# Patient Record
Sex: Male | Born: 1990 | Hispanic: Yes | Marital: Married | Smoking: Former smoker
Health system: Southern US, Community
[De-identification: ages and names within clinical notes are randomized; demographics above are authoritative.]

## PROBLEM LIST (undated history)

## (undated) ENCOUNTER — Emergency Department (HOSPITAL_COMMUNITY): Admission: EM | Discharge status: Absent without leave | Payer: Self-pay | Source: Home / Self Care

## (undated) DIAGNOSIS — K219 Gastro-esophageal reflux disease without esophagitis: Secondary | ICD-10-CM

## (undated) DIAGNOSIS — R51 Headache: Secondary | ICD-10-CM

## (undated) DIAGNOSIS — IMO0001 Reserved for inherently not codable concepts without codable children: Secondary | ICD-10-CM

## (undated) HISTORY — PX: VASECTOMY: SHX75

## (undated) HISTORY — PX: TONSILLECTOMY: SUR1361

---
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
## 2011-05-05 ENCOUNTER — Emergency Department: Payer: Self-pay | Admitting: Unknown Physician Specialty

## 2011-06-09 ENCOUNTER — Emergency Department: Payer: Self-pay | Admitting: Emergency Medicine

## 2011-07-19 ENCOUNTER — Emergency Department: Payer: Self-pay | Admitting: Emergency Medicine

## 2011-08-17 ENCOUNTER — Emergency Department: Payer: Self-pay | Admitting: Family Medicine

## 2011-08-18 ENCOUNTER — Emergency Department (HOSPITAL_COMMUNITY)
Admission: EM | Admit: 2011-08-18 | Discharge: 2011-08-18 | Disposition: A | Discharge status: Home / Self Care | Payer: Self-pay | Attending: Emergency Medicine | Admitting: Emergency Medicine

## 2011-08-18 DIAGNOSIS — M542 Cervicalgia: Secondary | ICD-10-CM | POA: Insufficient documentation

## 2011-08-18 DIAGNOSIS — G44209 Tension-type headache, unspecified, not intractable: Secondary | ICD-10-CM | POA: Insufficient documentation

## 2011-08-18 DIAGNOSIS — K219 Gastro-esophageal reflux disease without esophagitis: Secondary | ICD-10-CM | POA: Insufficient documentation

## 2011-08-18 DIAGNOSIS — M79609 Pain in unspecified limb: Secondary | ICD-10-CM | POA: Insufficient documentation

## 2011-08-18 DIAGNOSIS — M62838 Other muscle spasm: Secondary | ICD-10-CM | POA: Insufficient documentation

## 2011-08-18 DIAGNOSIS — M546 Pain in thoracic spine: Secondary | ICD-10-CM | POA: Insufficient documentation

## 2011-08-19 LAB — CBC
HCT: 40 % (ref 39.0–52.0)
Platelets: 218 10*3/uL (ref 150–400)
RBC: 4.94 MIL/uL (ref 4.22–5.81)
RDW: 13.1 % (ref 11.5–15.5)
WBC: 8.2 10*3/uL (ref 4.0–10.5)

## 2011-08-19 LAB — DIFFERENTIAL
Basophils Absolute: 0 10*3/uL (ref 0.0–0.1)
Eosinophils Relative: 2 % (ref 0–5)
Lymphocytes Relative: 37 % (ref 12–46)
Neutrophils Relative %: 50 % (ref 43–77)

## 2011-08-19 LAB — BASIC METABOLIC PANEL
BUN: 15 mg/dL (ref 6–23)
Chloride: 103 mEq/L (ref 96–112)
GFR calc Af Amer: >60 mL/min (ref >60)
Potassium: 4.2 mEq/L (ref 3.5–5.1)

## 2011-08-19 LAB — SEDIMENTATION RATE: Sed Rate: 0 mm/hr (ref 0–16)

## 2011-08-27 ENCOUNTER — Emergency Department (HOSPITAL_COMMUNITY)
Admission: EM | Admit: 2011-08-27 | Discharge: 2011-08-27 | Disposition: A | Discharge status: Home / Self Care | Payer: Self-pay | Attending: Emergency Medicine | Admitting: Emergency Medicine

## 2011-08-27 DIAGNOSIS — K219 Gastro-esophageal reflux disease without esophagitis: Secondary | ICD-10-CM | POA: Insufficient documentation

## 2011-08-27 DIAGNOSIS — M542 Cervicalgia: Secondary | ICD-10-CM | POA: Insufficient documentation

## 2011-08-27 DIAGNOSIS — R51 Headache: Secondary | ICD-10-CM | POA: Insufficient documentation

## 2011-09-12 ENCOUNTER — Inpatient Hospital Stay (INDEPENDENT_AMBULATORY_CARE_PROVIDER_SITE_OTHER)
Admission: RE | Admit: 2011-09-12 | Discharge: 2011-09-12 | Disposition: A | Discharge status: Home / Self Care | Payer: Self-pay | Source: Ambulatory Visit | Attending: Family Medicine | Admitting: Family Medicine

## 2011-09-12 DIAGNOSIS — G44209 Tension-type headache, unspecified, not intractable: Secondary | ICD-10-CM

## 2011-09-12 DIAGNOSIS — R51 Headache: Secondary | ICD-10-CM

## 2011-09-16 ENCOUNTER — Emergency Department (HOSPITAL_COMMUNITY): Payer: Self-pay

## 2011-09-16 ENCOUNTER — Emergency Department (HOSPITAL_COMMUNITY)
Admission: EM | Admit: 2011-09-16 | Discharge: 2011-09-17 | Disposition: A | Discharge status: Home / Self Care | Payer: Self-pay | Attending: Emergency Medicine | Admitting: Emergency Medicine

## 2011-09-16 DIAGNOSIS — F411 Generalized anxiety disorder: Secondary | ICD-10-CM | POA: Insufficient documentation

## 2011-09-16 DIAGNOSIS — R0789 Other chest pain: Secondary | ICD-10-CM | POA: Insufficient documentation

## 2011-09-16 DIAGNOSIS — J3489 Other specified disorders of nose and nasal sinuses: Secondary | ICD-10-CM | POA: Insufficient documentation

## 2011-09-16 DIAGNOSIS — R0602 Shortness of breath: Secondary | ICD-10-CM | POA: Insufficient documentation

## 2011-09-16 DIAGNOSIS — J40 Bronchitis, not specified as acute or chronic: Secondary | ICD-10-CM | POA: Insufficient documentation

## 2011-10-04 ENCOUNTER — Emergency Department (HOSPITAL_COMMUNITY)
Admission: EM | Admit: 2011-10-04 | Discharge: 2011-10-04 | Disposition: A | Discharge status: Home / Self Care | Payer: Medicaid Other | Attending: Emergency Medicine | Admitting: Emergency Medicine

## 2011-10-04 DIAGNOSIS — IMO0002 Reserved for concepts with insufficient information to code with codable children: Secondary | ICD-10-CM | POA: Insufficient documentation

## 2011-10-08 ENCOUNTER — Emergency Department (HOSPITAL_COMMUNITY)
Admission: EM | Admit: 2011-10-08 | Discharge: 2011-10-08 | Disposition: A | Discharge status: Home / Self Care | Payer: Medicaid Other | Attending: Emergency Medicine | Admitting: Emergency Medicine

## 2011-10-08 ENCOUNTER — Emergency Department (HOSPITAL_COMMUNITY): Payer: Medicaid Other

## 2011-10-08 DIAGNOSIS — IMO0001 Reserved for inherently not codable concepts without codable children: Secondary | ICD-10-CM | POA: Insufficient documentation

## 2011-10-08 DIAGNOSIS — N509 Disorder of male genital organs, unspecified: Secondary | ICD-10-CM | POA: Insufficient documentation

## 2011-10-08 DIAGNOSIS — K219 Gastro-esophageal reflux disease without esophagitis: Secondary | ICD-10-CM | POA: Insufficient documentation

## 2011-10-08 DIAGNOSIS — R059 Cough, unspecified: Secondary | ICD-10-CM | POA: Insufficient documentation

## 2011-10-08 DIAGNOSIS — B9789 Other viral agents as the cause of diseases classified elsewhere: Secondary | ICD-10-CM | POA: Insufficient documentation

## 2011-10-08 DIAGNOSIS — R300 Dysuria: Secondary | ICD-10-CM | POA: Insufficient documentation

## 2011-10-08 DIAGNOSIS — R05 Cough: Secondary | ICD-10-CM | POA: Insufficient documentation

## 2011-10-08 LAB — URINALYSIS, ROUTINE W REFLEX MICROSCOPIC
Nitrite: NEGATIVE
Specific Gravity, Urine: 1.013 (ref 1.005–1.030)
Urobilinogen, UA: 0.2 mg/dL (ref 0.0–1.0)

## 2011-10-09 LAB — RPR: RPR Ser Ql: NONREACTIVE

## 2011-10-11 LAB — GC/CHLAMYDIA PROBE AMP, GENITAL: GC Probe Amp, Genital: NEGATIVE

## 2011-11-04 ENCOUNTER — Ambulatory Visit
Admission: RE | Admit: 2011-11-04 | Discharge: 2011-11-04 | Disposition: A | Discharge status: Home / Self Care | Payer: Medicaid Other | Source: Ambulatory Visit | Attending: Specialist | Admitting: Specialist

## 2011-11-04 ENCOUNTER — Other Ambulatory Visit: Payer: Self-pay | Admitting: Specialist

## 2011-11-04 DIAGNOSIS — R52 Pain, unspecified: Secondary | ICD-10-CM

## 2011-11-07 ENCOUNTER — Encounter: Payer: Self-pay | Admitting: Emergency Medicine

## 2011-11-07 ENCOUNTER — Emergency Department (HOSPITAL_COMMUNITY)
Admission: EM | Admit: 2011-11-07 | Discharge: 2011-11-07 | Payer: Medicaid Other | Attending: Emergency Medicine | Admitting: Emergency Medicine

## 2011-11-07 DIAGNOSIS — M542 Cervicalgia: Secondary | ICD-10-CM | POA: Insufficient documentation

## 2011-11-07 DIAGNOSIS — R51 Headache: Secondary | ICD-10-CM | POA: Insufficient documentation

## 2011-11-07 MED ORDER — BUPIVACAINE HCL (PF) 0.5 % IJ SOLN
INTRAMUSCULAR | Status: AC
  Administered 2011-11-07: 13:00:00
  Filled 2011-11-07: qty 10

## 2011-11-07 MED ORDER — OXYCODONE-ACETAMINOPHEN 5-325 MG PO TABS: 2.0000 | ORAL_TABLET | ORAL | Status: AC | PRN

## 2011-11-07 NOTE — ED Notes (Signed)
Pt. Stated, I've had a HA since yesterday in the back of my head.

## 2011-11-07 NOTE — ED Provider Notes (Signed)
History     CSN: [id number] Arrival date & time: 11/07/2011 10:08 AM   First MD Initiated Contact with Patient 11/07/11 1122      Chief Complaint  Patient presents with  . Headache    (Consider location/radiation/quality/duration/timing/severity/associated sxs/prior treatment) HPI Pt c/o headache radiating down back of neck onset last night. Pain increases with looking down. Pt denies fever  History reviewed. No pertinent past medical history.  Past Surgical History  Procedure Date  . Tonsillectomy     No family history on file.  History  Substance Use Topics  . Smoking status: Never Smoker   . Smokeless tobacco: Not on file  . Alcohol Use: No      Review of Systems  Constitutional: Negative for fever.  Neurological: Positive for headaches. Negative for numbness.  All other systems reviewed and are negative.    Allergies  Azithromycin and Ibuprofen  Home Medications  No current outpatient prescriptions on file.  BP 107/54  Pulse 70  Temp(Src) 98.1 F (36.7 C) (Oral)  Resp 18  SpO2 99%  Physical Exam  Nursing note and vitals reviewed. Constitutional: He is oriented to person, place, and time. He appears well-developed and well-nourished. No distress.  HENT:  Head: Normocephalic and atraumatic.  Eyes: Pupils are equal, round, and reactive to light.  Neck: Normal range of motion.  Cardiovascular: Normal rate and intact distal pulses.   Pulmonary/Chest: No respiratory distress.  Abdominal: Normal appearance. He exhibits no distension.  Musculoskeletal: Normal range of motion.  Neurological: He is alert and oriented to person, place, and time. He has normal strength. No cranial nerve deficit or sensory deficit. Coordination and gait normal.  Skin: Skin is warm and dry. No rash noted.  Psychiatric: He has a normal mood and affect. His behavior is normal.    ED Course  Procedures (including critical care time) Area lateral to C6 prepped with Betadine  the skin was clean and 5 cc of 0.5% Marcaine injected in the paracervical plexus.  Aspiration done throughout procedure.  No complications.  Time out and appropriate precautions with identification of patient done prior to procedure. Labs Reviewed - No data to display No results found.   1. Headache       MDM          Nelia Shi, MD 11/07/11 1318

## 2011-11-07 NOTE — ED Notes (Signed)
Pt c/o headache radiating down back of neck onset last night. Pain increases with looking down. Pt denies fever.

## 2011-11-19 ENCOUNTER — Encounter (HOSPITAL_COMMUNITY): Payer: Self-pay | Admitting: *Deleted

## 2011-11-19 ENCOUNTER — Emergency Department (HOSPITAL_COMMUNITY)
Admission: EM | Admit: 2011-11-19 | Discharge: 2011-11-19 | Disposition: A | Discharge status: Home / Self Care | Payer: Medicaid Other | Attending: Emergency Medicine | Admitting: Emergency Medicine

## 2011-11-19 DIAGNOSIS — R509 Fever, unspecified: Secondary | ICD-10-CM | POA: Insufficient documentation

## 2011-11-19 DIAGNOSIS — IMO0001 Reserved for inherently not codable concepts without codable children: Secondary | ICD-10-CM | POA: Insufficient documentation

## 2011-11-19 DIAGNOSIS — R220 Localized swelling, mass and lump, head: Secondary | ICD-10-CM | POA: Insufficient documentation

## 2011-11-19 DIAGNOSIS — J029 Acute pharyngitis, unspecified: Secondary | ICD-10-CM | POA: Insufficient documentation

## 2011-11-19 DIAGNOSIS — R599 Enlarged lymph nodes, unspecified: Secondary | ICD-10-CM | POA: Insufficient documentation

## 2011-11-19 DIAGNOSIS — R51 Headache: Secondary | ICD-10-CM | POA: Insufficient documentation

## 2011-11-19 MED ORDER — PENICILLIN V POTASSIUM 250 MG PO TABS
500.0000 mg | ORAL_TABLET | Freq: Once | ORAL | Status: AC
  Administered 2011-11-19: 500 mg via ORAL
  Filled 2011-11-19: qty 2

## 2011-11-19 MED ORDER — PENICILLIN V POTASSIUM 500 MG PO TABS: 500.0000 mg | ORAL_TABLET | Freq: Three times a day (TID) | ORAL | Status: AC

## 2011-11-19 NOTE — ED Provider Notes (Signed)
Medical screening examination/treatment/procedure(s) were performed by non-physician practitioner and as supervising physician I was immediately available for consultation/collaboration.  Saramarie Stinger R. Tachina Spoonemore, MD 11/19/11 2349 

## 2011-11-19 NOTE — ED Provider Notes (Signed)
History     CSN: [id number] Arrival date & time: 11/19/2011  3:51 PM   First MD Initiated Contact with Patient 11/19/11 2003      Chief Complaint  Patient presents with  . Fever    (Consider location/radiation/quality/duration/timing/severity/associated sxs/prior treatment) HPI Comments: Patient reports, that he's had a sore throat and a low-grade fever for the past 2 days.  Sister was diagnosed with strep throat.  Several days before that he has been taking Tylenol for discomfort.  Patient is a 20 y.o. male presenting with fever. The history is provided by the patient.  Fever Primary symptoms of the febrile illness include fever, headaches and myalgias. Primary symptoms do not include nausea, vomiting or diarrhea. This is a new problem. The problem has not changed since onset.   History reviewed. No pertinent past medical history.  Past Surgical History  Procedure Date  . Tonsillectomy     History reviewed. No pertinent family history.  History  Substance Use Topics  . Smoking status: Passive Smoker  . Smokeless tobacco: Not on file  . Alcohol Use: No      Review of Systems  Constitutional: Positive for fever.  HENT: Positive for sore throat.   Respiratory: Negative.   Cardiovascular: Negative.   Gastrointestinal: Negative for nausea, vomiting and diarrhea.  Genitourinary: Negative.   Musculoskeletal: Positive for myalgias.  Neurological: Positive for headaches.  Hematological: Negative.   Psychiatric/Behavioral: Negative.     Allergies  Azithromycin and Ibuprofen  Home Medications   Current Outpatient Rx  Name Route Sig Dispense Refill  . PENICILLIN V POTASSIUM 500 MG PO TABS Oral Take 1 tablet (500 mg total) by mouth 3 (three) times daily. 30 tablet 0    BP 116/70  Pulse 92  Temp(Src) 99.7 F (37.6 C) (Oral)  Resp 20  SpO2 99%  Physical Exam  Constitutional: He appears well-developed and well-nourished.  HENT:  Head: Normocephalic.    Mouth/Throat: Posterior oropharyngeal edema and posterior oropharyngeal erythema present. No oropharyngeal exudate or tonsillar abscesses.  Eyes: EOM are normal.  Neck: Neck supple. No rigidity. Normal range of motion present.  Cardiovascular: Normal rate.   Abdominal: Soft.  Musculoskeletal: Normal range of motion.  Lymphadenopathy:    He has cervical adenopathy.  Skin: Skin is warm and dry.    ED Course  Procedures (including critical care time)  Labs Reviewed - No data to display No results found.   1. Pharyngitis       MDM  Pharyngitis most likely strep as Positive exposure         Arman Filter, NP 11/19/11 2044  Arman Filter, NP 11/19/11 2046

## 2011-11-19 NOTE — ED Notes (Signed)
He woke up this am with a temp headache and a scratchey throat since wednesday

## 2011-12-17 NOTE — ED Notes (Signed)
9/16 Dr. Juanetta Gosling had requested an appt. for pt. to f/u at the [facility]. I called them but they don't see Medicaid pt.'s. Dr. Juanetta Gosling said to try [facility]. I faxed referral to [facility] with pt.'s record. I called pt. and he said he wants to wait until he get his Medicaid before referral done. [facility] notified. 11/13 Fax received from [facility] that pt. has appt. 12/20 @1500  and pt. is aware of appt. Referral completed. Harold Anderson 12/17/2011

## 2012-01-25 ENCOUNTER — Other Ambulatory Visit: Payer: Self-pay | Admitting: Specialist

## 2012-01-25 DIAGNOSIS — R55 Syncope and collapse: Secondary | ICD-10-CM

## 2012-01-25 DIAGNOSIS — R51 Headache: Secondary | ICD-10-CM

## 2012-01-25 DIAGNOSIS — R42 Dizziness and giddiness: Secondary | ICD-10-CM

## 2012-01-28 ENCOUNTER — Other Ambulatory Visit: Payer: Medicaid Other

## 2012-02-01 ENCOUNTER — Emergency Department (HOSPITAL_COMMUNITY)
Admission: EM | Admit: 2012-02-01 | Discharge: 2012-02-02 | Disposition: A | Discharge status: Home / Self Care | Payer: Medicaid Other | Attending: Emergency Medicine | Admitting: Emergency Medicine

## 2012-02-01 DIAGNOSIS — R55 Syncope and collapse: Secondary | ICD-10-CM | POA: Insufficient documentation

## 2012-02-01 DIAGNOSIS — H547 Unspecified visual loss: Secondary | ICD-10-CM | POA: Insufficient documentation

## 2012-02-01 DIAGNOSIS — R51 Headache: Secondary | ICD-10-CM | POA: Insufficient documentation

## 2012-02-02 ENCOUNTER — Encounter (HOSPITAL_COMMUNITY): Payer: Self-pay

## 2012-02-02 ENCOUNTER — Emergency Department (HOSPITAL_COMMUNITY): Payer: Medicaid Other

## 2012-02-02 LAB — POCT I-STAT, CHEM 8
Calcium, Ion: 1.21 mmol/L (ref 1.12–1.32)
Glucose, Bld: 108 mg/dL — ABNORMAL HIGH (ref 70–99)
HCT: 49 % (ref 39.0–52.0)
Hemoglobin: 16.7 g/dL (ref 13.0–17.0)

## 2012-02-02 MED ORDER — DIPHENHYDRAMINE HCL 50 MG/ML IJ SOLN
25.0000 mg | Freq: Once | INTRAMUSCULAR | Status: AC
  Administered 2012-02-02: 25 mg via INTRAVENOUS
  Filled 2012-02-02: qty 1

## 2012-02-02 MED ORDER — SODIUM CHLORIDE 0.9 % IV SOLN
Freq: Once | INTRAVENOUS | Status: AC
  Administered 2012-02-02: 05:00:00 via INTRAVENOUS

## 2012-02-02 MED ORDER — DEXAMETHASONE SODIUM PHOSPHATE 10 MG/ML IJ SOLN
10.0000 mg | Freq: Once | INTRAMUSCULAR | Status: AC
  Administered 2012-02-02: 10 mg via INTRAVENOUS
  Filled 2012-02-02: qty 1

## 2012-02-02 MED ORDER — METOCLOPRAMIDE HCL 5 MG/ML IJ SOLN
10.0000 mg | Freq: Once | INTRAMUSCULAR | Status: AC
  Administered 2012-02-02: 10 mg via INTRAVENOUS
  Filled 2012-02-02: qty 2

## 2012-02-02 NOTE — ED Provider Notes (Signed)
Medical screening examination/treatment/procedure(s) were performed by non-physician practitioner and as supervising physician I was immediately available for consultation/collaboration.  Xiara Knisley R. Ady Heimann, MD 02/02/12 0843 

## 2012-02-02 NOTE — ED Provider Notes (Signed)
History     CSN: [id number]  Arrival date & time 02/01/12  2346   First MD Initiated Contact with Patient 02/02/12 [phone]      Chief Complaint  Patient presents with  . Headache  . Loss of Vision    HPI: Patient is a 21 y.o. male presenting with headaches. The history is provided by the patient.  Headache  This is a recurrent problem. The current episode started more than 1 week ago. The problem occurs constantly. The headache is associated with nothing. The pain is located in the right unilateral region. The pain is at a severity of 10/10. The pain is severe. The pain does not radiate. Associated symptoms include near-syncope and nausea. Pertinent negatives include no fever and no vomiting. He has tried NSAIDs for the symptoms. The treatment provided no relief.  Patient reports approximately three-month history of persistent headaches. States the headaches have been constant but intensity varies. Has been seen by his primary care physician several times with headaches. PCP believes headaches due to muscle tension. Patient states medications he's been given so far are not helping such as muscle relaxers and tramadol. Tonight while at work headache persisted and he suddenly became weak, his vision faded and he states he felt like he was going  to "black out" but did not. States his current headache is consistent with headaches he's had over the last several months. Visual symptoms have resolved.  History reviewed. No pertinent past medical history.  Past Surgical History  Procedure Date  . Tonsillectomy     History reviewed. No pertinent family history.  History  Substance Use Topics  . Smoking status: Passive Smoker  . Smokeless tobacco: Not on file  . Alcohol Use: No      Review of Systems  Constitutional: Negative.  Negative for fever.  Eyes: Negative.   Respiratory: Negative.   Cardiovascular: Positive for near-syncope.  Gastrointestinal: Positive for nausea. Negative for  vomiting.  Genitourinary: Negative.   Musculoskeletal: Negative.   Skin: Negative.   Neurological: Positive for headaches.  Hematological: Negative.   Psychiatric/Behavioral: Negative.     Allergies  Azithromycin and Ibuprofen  Home Medications   Current Outpatient Rx  Name Route Sig Dispense Refill  . TRAMADOL HCL 50 MG PO TABS Oral Take 100 mg by mouth every 6 (six) hours as needed.      BP 122/68  Pulse 85  Temp(Src) 97.8 F (36.6 C) (Oral)  Resp 18  Wt 190 lb (86.183 kg)  SpO2 100%  Physical Exam  Constitutional: He is oriented to person, place, and time. He appears well-developed and well-nourished.  HENT:  Head: Normocephalic and atraumatic.  Nose: Nose normal.  Eyes: Conjunctivae and EOM are normal. Pupils are equal, round, and reactive to light.  Neck: Normal range of motion. Neck supple.  Cardiovascular: Normal rate and regular rhythm.   Pulmonary/Chest: Effort normal and breath sounds normal.  Abdominal: Soft. Bowel sounds are normal.  Musculoskeletal: Normal range of motion.  Neurological: He is alert and oriented to person, place, and time. He has normal strength and normal reflexes. No cranial nerve deficit. He displays a negative Romberg sign. Coordination normal.  Skin: Skin is warm and dry.  Psychiatric: He has a normal mood and affect.    ED Course  Procedures  CT head without contrast without acute findings. Patient reports headache improved with IV fluids and medication. Findings and clinical impression discussed with patient. Will plan to discharge home and encourage close follow up  with primary care physician for persistent headaches. Patient agreeable with plan.  Labs Reviewed  POCT I-STAT, CHEM 8 - Abnormal; Notable for the following:    Glucose, Bld 108 (*)    All other components within normal limits   Ct Head Wo Contrast  02/02/2012  *RADIOLOGY REPORT*  Clinical Data: Right-sided headache  CT HEAD WITHOUT CONTRAST  Technique:  Contiguous  axial images were obtained from the base of the skull through the vertex without contrast.  Comparison: None.  Findings: There is no evidence for acute hemorrhage, hydrocephalus, mass lesion, or abnormal extra-axial fluid collection.  No definite CT evidence for acute infarction.  The visualized paranasal sinuses and mastoid air cells are predominately clear.  IMPRESSION: No acute intracranial abnormality.  Original Report Authenticated By: Waneta Martins, M.D.     No diagnosis found.    MDM  HPI/PE and clinical findings c/w 1. Persistent H/A's (Ct normal, no focal neurological findings, H/A improved w/ IVF's and meds) 2. Near syncope        Leanne Chang, NP 02/02/12 [phone]

## 2012-02-02 NOTE — ED Notes (Signed)
Pt complains of a right sided headache for three days, tonight while at work, his family received a call that he had a "blackout episode" and he lost his vision for a short period of time

## 2012-02-04 ENCOUNTER — Ambulatory Visit: Payer: Medicaid Other | Admitting: Physical Therapy

## 2012-02-18 ENCOUNTER — Ambulatory Visit: Payer: Medicaid Other | Admitting: Physical Therapy

## 2012-04-26 ENCOUNTER — Emergency Department (HOSPITAL_COMMUNITY)
Admission: EM | Admit: 2012-04-26 | Discharge: 2012-04-26 | Disposition: A | Discharge status: Home / Self Care | Payer: Medicaid Other | Attending: Emergency Medicine | Admitting: Emergency Medicine

## 2012-04-26 ENCOUNTER — Encounter (HOSPITAL_COMMUNITY): Payer: Self-pay | Admitting: Family Medicine

## 2012-04-26 DIAGNOSIS — Y990 Civilian activity done for income or pay: Secondary | ICD-10-CM | POA: Insufficient documentation

## 2012-04-26 DIAGNOSIS — S61209A Unspecified open wound of unspecified finger without damage to nail, initial encounter: Secondary | ICD-10-CM | POA: Insufficient documentation

## 2012-04-26 DIAGNOSIS — Z23 Encounter for immunization: Secondary | ICD-10-CM | POA: Insufficient documentation

## 2012-04-26 DIAGNOSIS — W278XXA Contact with other nonpowered hand tool, initial encounter: Secondary | ICD-10-CM | POA: Insufficient documentation

## 2012-04-26 MED ORDER — TETANUS-DIPHTH-ACELL PERTUSSIS 5-2.5-18.5 LF-MCG/0.5 IM SUSP
0.5000 mL | Freq: Once | INTRAMUSCULAR | Status: AC
  Administered 2012-04-26: 0.5 mL via INTRAMUSCULAR
  Filled 2012-04-26: qty 0.5

## 2012-04-26 MED ORDER — CEPHALEXIN 500 MG PO CAPS: 500.0000 mg | ORAL_CAPSULE | Freq: Two times a day (BID) | ORAL | Status: AC

## 2012-04-26 MED ORDER — "THROMBI-PAD 3""X3"" EX PADS"
MEDICATED_PAD | CUTANEOUS | Status: AC
  Filled 2012-04-26: qty 1

## 2012-04-26 MED ORDER — HYDROCODONE-ACETAMINOPHEN 5-325 MG PO TABS: 1.0000 | ORAL_TABLET | ORAL | Status: AC | PRN

## 2012-04-26 NOTE — ED Provider Notes (Signed)
History     CSN: [id number]  Arrival date & time 04/26/12  1001   First MD Initiated Contact with Patient 04/26/12 1017      Chief Complaint  Patient presents with  . Laceration    (Consider location/radiation/quality/duration/timing/severity/associated sxs/prior treatment) HPI Comments: Patient reports he accidentally cut the tip of his left thumb off with a meat slicer while at work.  Denies other injury.  Denies weakness or numbness of the finger, denies difficulty moving finger.    Patient is a 21 y.o. male presenting with skin laceration. The history is provided by the patient.  Laceration     History reviewed. No pertinent past medical history.  Past Surgical History  Procedure Date  . Tonsillectomy     History reviewed. No pertinent family history.  History  Substance Use Topics  . Smoking status: Passive Smoker  . Smokeless tobacco: Not on file  . Alcohol Use: No      Review of Systems  Skin: Positive for wound.  Neurological: Negative for syncope, weakness and numbness.  Psychiatric/Behavioral: Negative for confusion.    Allergies  Azithromycin and Ibuprofen  Home Medications   Current Outpatient Rx  Name Route Sig Dispense Refill  . TRAMADOL HCL 50 MG PO TABS Oral Take 100 mg by mouth every 6 (six) hours as needed. For pain      BP 129/97  Pulse 81  Temp(Src) 98.8 F (37.1 C) (Oral)  Resp 20  SpO2 100%  Physical Exam  Nursing note and vitals reviewed. Constitutional: He is oriented to person, place, and time. He appears well-developed and well-nourished.  HENT:  Head: Normocephalic and atraumatic.  Neck: Neck supple.  Pulmonary/Chest: Effort normal.  Musculoskeletal:       Hands:      Left thumb with full AROM, full strength, sensation intact, capillary refill less than two seconds.    Neurological: He is alert and oriented to person, place, and time.  Skin: Laceration noted.    ED Course  Procedures (including critical care  time)  Labs Reviewed - No data to display No results found.   LACERATION REPAIR Performed by: Rise Patience Consent: Verbal consent obtained. Risks and benefits: risks, benefits and alternatives were discussed Patient identity confirmed: provided demographic data Time out performed prior to procedure Prepped and Draped in normal sterile fashion Wound explored  Laceration Location: left distal thumb   Laceration Length:  Amputation - superficial, involving part of nail bed  No Foreign Bodies seen or palpated  Anesthesia: digital block  Local anesthetic: lidocaine 2% no epinephrine  Anesthetic total: 5 ml  Irrigation method: syringe, lavage Amount of cleaning: standard  Skin closure: none.  Thrombipad and pressure  Number of sutures or staples: 0  Technique: see above  Patient tolerance: Patient tolerated the procedure well with no immediate complications.    1. Fingertip amputation, initial encounter       MDM  Patient presents to ED after amputation distal aspect of thumb.  Amputation was complete, nothing to suture. Neurovascularly intact.  The wound is not deep enough for bony involvement.  It is clean cut and there is no evidence of FB.  Wound thoroughly cleansed in ED.  Thrombi pad placed with good improvement of bleeding.  Wound dressed.  I discussed return precautions with the patient, including monitoring for infection as well as uncontrolled bleeding.  Pt d/c home with keflex to prevent infection and pain medication.  Pt to follow up with his PCP, urgent care, or  ER in two days for a recheck to ensure proper wound healing.  Patient verbalizes understanding and agrees with plan.         Rise Patience, [place] 04/26/12 1328

## 2012-04-26 NOTE — ED Provider Notes (Signed)
Medical screening examination/treatment/procedure(s) were performed by non-physician practitioner and as supervising physician I was immediately available for consultation/collaboration.   Gwyneth Sprout, MD 04/26/12 2041

## 2012-04-26 NOTE — ED Notes (Signed)
Pt sts was at work today and cut tip of left thumb with meat slicer.

## 2012-04-26 NOTE — Discharge Instructions (Signed)
Read the information below.  Keep the wound covered and do not change the bandage for 24 hours.  Then, please keep the wound clean and covered with a thin layer of antibiotic ointment.  Please see Dr Mayford Knife, the urgent care, or the ER in 2 days for a recheck of the wound.  Return to the ER immediately if you develop redness, swelling, pus draining from the wound, red streaks going up your arm, difficulty bending or your thumb, or fevers greater than 100.4.  Please also return for uncontrolled bleeding not relieved with 10 minutes of direct pressure. You may return to the ER at any time for worsening condition or any new symptoms that concern you.

## 2012-10-03 ENCOUNTER — Emergency Department (INDEPENDENT_AMBULATORY_CARE_PROVIDER_SITE_OTHER)
Admission: EM | Admit: 2012-10-03 | Discharge: 2012-10-03 | Disposition: A | Discharge status: Home / Self Care | Payer: Medicaid Other | Source: Home / Self Care | Attending: Family Medicine | Admitting: Family Medicine

## 2012-10-03 ENCOUNTER — Encounter (HOSPITAL_COMMUNITY): Payer: Self-pay | Admitting: *Deleted

## 2012-10-03 DIAGNOSIS — K219 Gastro-esophageal reflux disease without esophagitis: Secondary | ICD-10-CM

## 2012-10-03 DIAGNOSIS — R51 Headache: Secondary | ICD-10-CM

## 2012-10-03 MED ORDER — ESOMEPRAZOLE MAGNESIUM 40 MG PO CPDR: 40.0000 mg | DELAYED_RELEASE_CAPSULE | Freq: Every day | ORAL | Status: DC

## 2012-10-03 MED ORDER — GI COCKTAIL ~~LOC~~
30.0000 mL | Freq: Once | ORAL | Status: AC
  Administered 2012-10-03: 30 mL via ORAL

## 2012-10-03 MED ORDER — GI COCKTAIL ~~LOC~~
ORAL | Status: AC
  Filled 2012-10-03: qty 30

## 2012-10-03 NOTE — ED Provider Notes (Signed)
History     CSN: [id number]  Arrival date & time 10/03/12  1731   First MD Initiated Contact with Patient 10/03/12 1745      Chief Complaint  Patient presents with  . Headache    (Consider location/radiation/quality/duration/timing/severity/associated sxs/prior treatment) Patient is a 21 y.o. male presenting with headaches. The history is provided by the patient.  Headache The primary symptoms include headaches. Primary symptoms do not include dizziness, fever, nausea or vomiting. The symptoms began more than 1 week ago (1 yr h/o occipital ha. see nurse's note.). The symptoms are unchanged. The neurological symptoms are focal.  The headache is associated with neck stiffness and loss of balance. The headache is not associated with aura, photophobia or weakness.  Additional symptoms include neck stiffness and loss of balance. Additional symptoms do not include weakness, photophobia, aura, hallucinations or vertigo. Workup history includes CT scan.    History reviewed. No pertinent past medical history.  Past Surgical History  Procedure Date  . Tonsillectomy     Family History  Problem Relation Age of Onset  . Heart failure Other     History  Substance Use Topics  . Smoking status: Former Games developer  . Smokeless tobacco: Not on file  . Alcohol Use: No      Review of Systems  Constitutional: Negative for fever.  HENT: Positive for neck stiffness.   Eyes: Negative for photophobia.  Gastrointestinal: Negative for nausea and vomiting.       Epigastric and substernal burning sensation.  Neurological: Positive for headaches and loss of balance. Negative for dizziness, vertigo and weakness.  Psychiatric/Behavioral: Negative for hallucinations.    Allergies  Azithromycin and Ibuprofen  Home Medications   Current Outpatient Rx  Name Route Sig Dispense Refill  . ESOMEPRAZOLE MAGNESIUM 40 MG PO CPDR Oral Take 1 capsule (40 mg total) by mouth daily. 30 capsule 0  . TRAMADOL  HCL 50 MG PO TABS Oral Take 100 mg by mouth every 6 (six) hours as needed. For pain      BP 136/75  Pulse 78  Temp 98.4 F (36.9 C) (Oral)  Resp 18  SpO2 100%  Physical Exam  Nursing note and vitals reviewed. Constitutional: He is oriented to person, place, and time. He appears well-developed and well-nourished.  HENT:  Head: Normocephalic.  Mouth/Throat: Oropharynx is clear and moist.  Eyes: Conjunctivae normal are normal. Pupils are equal, round, and reactive to light.  Neck: Normal range of motion. Neck supple.  Cardiovascular: Normal rate, regular rhythm, normal heart sounds and intact distal pulses.   Pulmonary/Chest: Breath sounds normal.  Abdominal: Soft. Bowel sounds are normal. There is tenderness in the epigastric area. There is no rigidity, no guarding and no CVA tenderness.  Lymphadenopathy:    He has no cervical adenopathy.  Neurological: He is alert and oriented to person, place, and time.  Skin: Skin is warm and dry.    ED Course  Procedures (including critical care time)  Labs Reviewed - No data to display No results found.   1. Headache, chronic daily   2. GERD (gastroesophageal reflux disease)       MDM          Linna Hoff, MD 10/03/12 [phone]

## 2012-10-03 NOTE — ED Notes (Signed)
Pt reports " i have had a headache for the past year and it is going down his back. I have seen my primary doctor and he has run ever kind of test and I have been to the er twice and had two cat scans - they can't find anything. I have an appointment with the neurologist on the 18th"

## 2012-10-03 NOTE — ED Notes (Signed)
Pt reports that he has tried otc meds, prescription meds,.warm heat , acupuncture, muscle relaxants and herbal meds with little relief

## 2012-10-08 ENCOUNTER — Encounter (HOSPITAL_COMMUNITY): Payer: Self-pay | Admitting: *Deleted

## 2012-10-08 ENCOUNTER — Emergency Department (HOSPITAL_COMMUNITY)
Admission: EM | Admit: 2012-10-08 | Discharge: 2012-10-09 | Disposition: A | Discharge status: Home / Self Care | Payer: Medicaid Other | Attending: Emergency Medicine | Admitting: Emergency Medicine

## 2012-10-08 DIAGNOSIS — Z87891 Personal history of nicotine dependence: Secondary | ICD-10-CM | POA: Insufficient documentation

## 2012-10-08 DIAGNOSIS — R0602 Shortness of breath: Secondary | ICD-10-CM | POA: Insufficient documentation

## 2012-10-08 DIAGNOSIS — R071 Chest pain on breathing: Secondary | ICD-10-CM | POA: Insufficient documentation

## 2012-10-08 DIAGNOSIS — R0789 Other chest pain: Secondary | ICD-10-CM

## 2012-10-08 HISTORY — DX: Headache: R51

## 2012-10-08 NOTE — ED Notes (Signed)
Pt arrived in room, Lab at bedside

## 2012-10-08 NOTE — ED Notes (Signed)
C/o sob, onset 1 week ago, also some nausea and "chest hurts", (denies: cough, congestion, cold sx, fever, nvd or dizziness).

## 2012-10-09 ENCOUNTER — Emergency Department (HOSPITAL_COMMUNITY): Payer: Medicaid Other

## 2012-10-09 LAB — CBC WITH DIFFERENTIAL/PLATELET
Eosinophils Relative: 1 % (ref 0–5)
HCT: 46.1 % (ref 39.0–52.0)
Hemoglobin: 15.4 g/dL (ref 13.0–17.0)
MCH: 27 pg (ref 26.0–34.0)
MCHC: 33.4 g/dL (ref 30.0–36.0)
Neutro Abs: 7.4 10*3/uL (ref 1.7–7.7)
Neutrophils Relative %: 67 % (ref 43–77)
RDW: 12.7 % (ref 11.5–15.5)

## 2012-10-09 LAB — POCT I-STAT TROPONIN I: Troponin i, poc: 0.02 ng/mL (ref 0.00–0.08)

## 2012-10-09 LAB — POCT I-STAT, CHEM 8
BUN: 8 mg/dL (ref 6–23)
Calcium, Ion: 1.18 mmol/L (ref 1.12–1.23)
Chloride: 103 mEq/L (ref 96–112)
Sodium: 141 mEq/L (ref 135–145)

## 2012-10-09 LAB — D-DIMER, QUANTITATIVE: D-Dimer, Quant: <0.27 ug/mL-FEU (ref 0.00–0.48)

## 2012-10-09 MED ORDER — TRAMADOL HCL 50 MG PO TABS: 50.0000 mg | ORAL_TABLET | Freq: Four times a day (QID) | ORAL | Status: DC | PRN

## 2012-10-09 MED ORDER — ACETAMINOPHEN 325 MG PO TABS
650.0000 mg | ORAL_TABLET | Freq: Once | ORAL | Status: AC
  Administered 2012-10-09: 650 mg via ORAL
  Filled 2012-10-09: qty 2

## 2012-10-09 NOTE — ED Notes (Signed)
Pt states "I've been having real hard to breathe sessions and my chest has been tight." Denies cough, congestion. NAD noted.

## 2012-10-09 NOTE — ED Provider Notes (Signed)
History     CSN: [id number]  Arrival date & time 10/08/12  2340   First MD Initiated Contact with Patient 10/08/12 2355      Chief Complaint  Patient presents with  . Shortness of Breath    (Consider location/radiation/quality/duration/timing/severity/associated sxs/prior treatment) HPI Hx per PT. Sternal non radiating CP for the last week, sharp and sore and sometimes stinging. Hurts to take a deep breath, hurts to move his arms or any kind of movement including laying on his stomach watching TV propped up on his arms. Mild to MOD pain. PCP Dr Mayford Knife.  No recent illness. No trauma or heavy lifting. No cough. No SOB other than hurts to take a deep breath. No leg pain or swelling. Sister has h/o DVT. PT denies h/o same. No longer taking nexium, denies heartburn.  Past Medical History  Diagnosis Date  . Headache     Past Surgical History  Procedure Date  . Tonsillectomy     Family History  Problem Relation Age of Onset  . Heart failure Other     History  Substance Use Topics  . Smoking status: Former Smoker    Quit date: 08/08/2012  . Smokeless tobacco: Not on file  . Alcohol Use: No      Review of Systems  Constitutional: Negative for fever and chills.  HENT: Negative for neck pain and neck stiffness.   Eyes: Negative for pain.  Respiratory: Negative for cough and chest tightness.   Cardiovascular: Positive for chest pain.  Gastrointestinal: Negative for abdominal pain.  Genitourinary: Negative for dysuria.  Musculoskeletal: Negative for back pain.  Skin: Negative for rash.  Neurological: Negative for headaches.  All other systems reviewed and are negative.    Allergies  Azithromycin and Ibuprofen  Home Medications   Current Outpatient Rx  Name Route Sig Dispense Refill  . ESOMEPRAZOLE MAGNESIUM 40 MG PO CPDR Oral Take 1 capsule (40 mg total) by mouth daily. 30 capsule 0  . TRAMADOL HCL 50 MG PO TABS Oral Take 100 mg by mouth every 6 (six) hours as  needed. For pain      BP 130/77  Pulse 90  Temp 98.4 F (36.9 C) (Oral)  Resp 18  SpO2 98%  Physical Exam  Constitutional: He is oriented to person, place, and time. He appears well-developed and well-nourished.  HENT:  Head: Normocephalic and atraumatic.  Eyes: Conjunctivae normal and EOM are normal. Pupils are equal, round, and reactive to light.  Neck: Trachea normal. Neck supple. No thyromegaly present.  Cardiovascular: Normal rate, regular rhythm, S1 normal, S2 normal and normal pulses.     No systolic murmur is present   No diastolic murmur is present  Pulses:      Radial pulses are 2+ on the right side, and 2+ on the left side.  Pulmonary/Chest: Effort normal and breath sounds normal. He has no wheezes. He has no rhonchi. He has no rales.       Reproducible chest tenderness over sternum, no crepitus or rash  Abdominal: Soft. Normal appearance and bowel sounds are normal. There is no tenderness. There is no CVA tenderness and negative Murphy's sign.  Musculoskeletal:       BLE:s Calves nontender, no cords or erythema, negative Homans sign  Neurological: He is alert and oriented to person, place, and time. He has normal strength. No cranial nerve deficit or sensory deficit. GCS eye subscore is 4. GCS verbal subscore is 5. GCS motor subscore is 6.  Skin: Skin  is warm and dry. No rash noted. He is not diaphoretic.  Psychiatric: His speech is normal.       Cooperative and appropriate    ED Course  Procedures (including critical care time)  Results for orders placed during the hospital encounter of 10/08/12  CBC WITH DIFFERENTIAL      Component Value Range   WBC 11.1 (*) 4.0 - 10.5 K/uL   RBC 5.71  4.22 - 5.81 MIL/uL   Hemoglobin 15.4  13.0 - 17.0 g/dL   HCT 04.5  40.9 - 81.1 %   MCV 80.7  78.0 - 100.0 fL   MCH 27.0  26.0 - 34.0 pg   MCHC 33.4  30.0 - 36.0 g/dL   RDW 91.4  78.2 - 95.6 %   Platelets 257  150 - 400 K/uL   Neutrophils Relative 67  43 - 77 %   Neutro Abs  7.4  1.7 - 7.7 K/uL   Lymphocytes Relative 26  12 - 46 %   Lymphs Abs 2.8  0.7 - 4.0 K/uL   Monocytes Relative 7  3 - 12 %   Monocytes Absolute 0.7  0.1 - 1.0 K/uL   Eosinophils Relative 1  0 - 5 %   Eosinophils Absolute 0.1  0.0 - 0.7 K/uL   Basophils Relative 0  0 - 1 %   Basophils Absolute 0.0  0.0 - 0.1 K/uL  D-DIMER, QUANTITATIVE      Component Value Range   D-Dimer, Quant <0.27  0.00 - 0.48 ug/mL-FEU  POCT I-STAT, CHEM 8      Component Value Range   Sodium 141  135 - 145 mEq/L   Potassium 4.2  3.5 - 5.1 mEq/L   Chloride 103  96 - 112 mEq/L   BUN 8  6 - 23 mg/dL   Creatinine, Ser 2.13  0.50 - 1.35 mg/dL   Glucose, Bld 086 (*) 70 - 99 mg/dL   Calcium, Ion 5.78  4.69 - 1.23 mmol/L   TCO2 27  0 - 100 mmol/L   Hemoglobin 16.7  13.0 - 17.0 g/dL   HCT 62.9  52.8 - 41.3 %  POCT I-STAT TROPONIN I      Component Value Range   Troponin i, poc 0.02  0.00 - 0.08 ng/mL   Comment 3            Dg Chest 2 View  10/09/2012  *RADIOLOGY REPORT*  Clinical Data: Shortness of breath that started 1 week ago.  Nausea and chest discomfort.  Smoker.  CHEST - 2 VIEW  Comparison: 10/08/2011  Findings: A minimal pectus excavatum deformity. Midline trachea. Normal heart size and mediastinal contours. No pleural effusion or pneumothorax.  Clear lungs.  IMPRESSION: No acute cardiopulmonary disease.   Original Report Authenticated By: Consuello Bossier, M.D.      Date: 10/09/2012  Rate: 77  Rhythm: normal sinus rhythm  QRS Axis: normal  Intervals: normal  ST/T Wave abnormalities: nonspecific ST changes  Conduction Disutrbances:none  Narrative Interpretation:   Old EKG Reviewed: none available  On further history, had tried his sisters albuterol inhaler without any change in symptoms.   Motrin allergy, given tylenol and RX for ultram. Chest wall pain precautions verbalized as understood.   Plan PCP follow up and return here for any worsening condition.  MDM   Reproducible cheat wall pain  suggesting non cardiac CP, screening ECG, labs and CXR as above. RX and precautions provided. VS and nursing notes reviewed.  Sunnie Nielsen, MD 10/09/12 [phone]

## 2012-10-09 NOTE — ED Notes (Signed)
MD at bedside. 

## 2012-11-08 ENCOUNTER — Encounter (HOSPITAL_COMMUNITY): Payer: Self-pay | Admitting: Emergency Medicine

## 2012-11-08 ENCOUNTER — Emergency Department (INDEPENDENT_AMBULATORY_CARE_PROVIDER_SITE_OTHER)
Admission: EM | Admit: 2012-11-08 | Discharge: 2012-11-08 | Disposition: A | Discharge status: Home / Self Care | Payer: Medicaid Other | Source: Home / Self Care | Attending: Family Medicine | Admitting: Family Medicine

## 2012-11-08 DIAGNOSIS — G43909 Migraine, unspecified, not intractable, without status migrainosus: Secondary | ICD-10-CM

## 2012-11-08 DIAGNOSIS — K219 Gastro-esophageal reflux disease without esophagitis: Secondary | ICD-10-CM

## 2012-11-08 HISTORY — DX: Reserved for inherently not codable concepts without codable children: IMO0001

## 2012-11-08 HISTORY — DX: Gastro-esophageal reflux disease without esophagitis: K21.9

## 2012-11-08 LAB — POCT H PYLORI SCREEN: H. PYLORI SCREEN, POC: NEGATIVE

## 2012-11-08 LAB — POCT RAPID STREP A: Streptococcus, Group A Screen (Direct): NEGATIVE

## 2012-11-08 MED ORDER — TRAMADOL-ACETAMINOPHEN 37.5-325 MG PO TABS: 1.0000 | ORAL_TABLET | Freq: Four times a day (QID) | ORAL | Status: DC | PRN

## 2012-11-08 MED ORDER — RANITIDINE HCL 150 MG PO CAPS: 150.0000 mg | ORAL_CAPSULE | Freq: Every day | ORAL | Status: DC

## 2012-11-08 MED ORDER — METOCLOPRAMIDE HCL 5 MG PO TABS: 5.0000 mg | ORAL_TABLET | Freq: Two times a day (BID) | ORAL | Status: DC | PRN

## 2012-11-08 MED ORDER — ESOMEPRAZOLE MAGNESIUM 40 MG PO CPDR: DELAYED_RELEASE_CAPSULE | ORAL | Status: DC

## 2012-11-08 NOTE — ED Notes (Signed)
Sore throat, nausea, and diarrhea.  Patient reports he experiences these symptoms every day.  Symptoms for 2 months.  Notified pcp, thought to be reflux.

## 2012-11-08 NOTE — ED Provider Notes (Signed)
History     CSN: [id number]  Arrival date & time 11/08/12  1419   First MD Initiated Contact with Patient 11/08/12 1508      Chief Complaint  Patient presents with  . Sore Throat    (Consider location/radiation/quality/duration/timing/severity/associated sxs/prior treatment) HPI Comments: 21 year old male former smoker with history of migraines and acid reflux. Here complaining of:  #1) headache exacerbation. Patient stated he is being followed by neurologist and had a recent MRI and has the pending result he denies any seizures, no visual changes no extremity weakness or numbness, no balance or gait problems. Patient reports that he needs refilled his tramadol as he ran out and his primary doctor's office was closed.  #2) sore throat for 2 months. Patient has been diagnosed with acid reflux in the past. He states that he no longer has acid taste in his mouth and he doesn't think acid reflux is the cause of the sore throat. He is no longer smoking. his symptoms are  associated with epigastric discomfort, nausea,  decreased appetite and also reports some episodes of loose stools. Denies fever or chills. No sudden weight loss. He is currently taking omeprazole once a day inconsistently.    Past Medical History  Diagnosis Date  . Headache   . Reflux     Past Surgical History  Procedure Date  . Tonsillectomy     Family History  Problem Relation Age of Onset  . Heart failure Other     History  Substance Use Topics  . Smoking status: Former Smoker    Quit date: 08/08/2012  . Smokeless tobacco: Not on file  . Alcohol Use: No      Review of Systems  Constitutional: Positive for appetite change. Negative for fever, chills, diaphoresis, fatigue and unexpected weight change.  HENT: Positive for sore throat. Negative for ear pain, congestion, trouble swallowing and neck pain.   Gastrointestinal: Positive for nausea. Negative for vomiting.  Musculoskeletal: Negative for myalgias  and arthralgias.  Neurological: Positive for headaches. Negative for dizziness, seizures, weakness and numbness.  Hematological: Negative for adenopathy.  All other systems reviewed and are negative.    Allergies  Azithromycin and Ibuprofen  Home Medications   Current Outpatient Rx  Name  Route  Sig  Dispense  Refill  . NEXIUM PO   Oral   Take by mouth.         . OMEPRAZOLE PO   Oral   Take by mouth.         Marland Kitchen RANITIDINE HCL PO   Oral   Take by mouth.         . TRAMADOL HCL 50 MG PO TABS   Oral   Take 1 tablet (50 mg total) by mouth every 6 (six) hours as needed for pain.   15 tablet   0   . TRAMADOL-ACETAMINOPHEN 37.5-325 MG PO TABS   Oral   Take 1 tablet by mouth 3 (three) times daily as needed. For pain           BP 122/83  Pulse 80  Temp 98.5 F (36.9 C) (Oral)  Resp 20  SpO2 99%  Physical Exam  Nursing note and vitals reviewed. Constitutional: He is oriented to person, place, and time. He appears well-developed and well-nourished. No distress.  HENT:  Head: Normocephalic and atraumatic.  Right Ear: External ear normal.  Left Ear: External ear normal.       Nose normal Significant pharyngeal erythema no exudates. No uvula deviation.  No trismus. TM's normal  Eyes: Conjunctivae normal are normal. No scleral icterus.  Neck: Neck supple. No thyromegaly present.  Cardiovascular: Normal rate, regular rhythm and normal heart sounds.   Pulmonary/Chest: Effort normal and breath sounds normal. No respiratory distress. He has no wheezes. He has no rales.  Abdominal: Soft. He exhibits no distension and no mass. There is no rebound and no guarding.       Reported epigastric tenderness with deep palpation  Lymphadenopathy:       Head (right side): No submandibular, no preauricular, no posterior auricular and no occipital adenopathy present.       Head (left side): No submandibular, no preauricular, no posterior auricular and no occipital adenopathy  present.    He has no cervical adenopathy.    He has no axillary adenopathy.  Neurological: He is alert and oriented to person, place, and time.  Skin: No rash noted. He is not diaphoretic.    ED Course  Procedures (including critical care time)   Labs Reviewed  POCT RAPID STREP A ([facility] CARE ONLY)  POCT H PYLORI SCREEN   No results found.   1. Migraine headache   2. Acid reflux       MDM  1) Migraines:Unchanged from prior episodes. Refilled Ultracet, prescribed Reglan. 2) Sore throat: Negative poc strep and negative H.pilory. Treated with nexium, ranitidine and antireflux measures.  Impress possible underlying mood disorder.  Asked to follow up with PCP to monitor symptoms. GI referral provided today to follow up as needed.         Sharin Grave, MD 11/09/12 [phone]

## 2013-05-22 ENCOUNTER — Emergency Department (HOSPITAL_COMMUNITY)
Admission: EM | Admit: 2013-05-22 | Discharge: 2013-05-22 | Discharge status: Against Medical Advice | Payer: Medicaid Other | Attending: Emergency Medicine | Admitting: Emergency Medicine

## 2013-05-22 ENCOUNTER — Encounter (HOSPITAL_COMMUNITY): Payer: Self-pay | Admitting: Adult Health

## 2013-05-22 DIAGNOSIS — Z8719 Personal history of other diseases of the digestive system: Secondary | ICD-10-CM | POA: Insufficient documentation

## 2013-05-22 DIAGNOSIS — J029 Acute pharyngitis, unspecified: Secondary | ICD-10-CM | POA: Insufficient documentation

## 2013-05-22 LAB — RAPID STREP SCREEN (MED CTR MEBANE ONLY): Streptococcus, Group A Screen (Direct): NEGATIVE

## 2013-05-22 NOTE — ED Notes (Signed)
Present with sore throat for 4 months. Has been seen at primary care and Dx with GERD. Pt wants referral to ENT, states, "it hurts under my adam's apple and it feels like I can't breath when I lay down at night"

## 2016-02-07 ENCOUNTER — Emergency Department (HOSPITAL_COMMUNITY)
Admission: EM | Admit: 2016-02-07 | Discharge: 2016-02-07 | Disposition: A | Discharge status: Home / Self Care | Payer: Worker's Compensation | Attending: Emergency Medicine | Admitting: Emergency Medicine

## 2016-02-07 ENCOUNTER — Emergency Department (HOSPITAL_COMMUNITY): Payer: Worker's Compensation

## 2016-02-07 ENCOUNTER — Encounter (HOSPITAL_COMMUNITY): Payer: Self-pay | Admitting: *Deleted

## 2016-02-07 DIAGNOSIS — S29002A Unspecified injury of muscle and tendon of back wall of thorax, initial encounter: Secondary | ICD-10-CM | POA: Insufficient documentation

## 2016-02-07 DIAGNOSIS — W108XXA Fall (on) (from) other stairs and steps, initial encounter: Secondary | ICD-10-CM | POA: Insufficient documentation

## 2016-02-07 DIAGNOSIS — Y9289 Other specified places as the place of occurrence of the external cause: Secondary | ICD-10-CM | POA: Diagnosis not present

## 2016-02-07 DIAGNOSIS — S3992XA Unspecified injury of lower back, initial encounter: Secondary | ICD-10-CM

## 2016-02-07 DIAGNOSIS — Z8719 Personal history of other diseases of the digestive system: Secondary | ICD-10-CM | POA: Diagnosis not present

## 2016-02-07 DIAGNOSIS — Y9389 Activity, other specified: Secondary | ICD-10-CM | POA: Diagnosis not present

## 2016-02-07 DIAGNOSIS — Y990 Civilian activity done for income or pay: Secondary | ICD-10-CM | POA: Diagnosis not present

## 2016-02-07 DIAGNOSIS — W19XXXA Unspecified fall, initial encounter: Secondary | ICD-10-CM

## 2016-02-07 DIAGNOSIS — Z87891 Personal history of nicotine dependence: Secondary | ICD-10-CM | POA: Insufficient documentation

## 2016-02-07 MED ORDER — OXYCODONE-ACETAMINOPHEN 5-325 MG PO TABS: 2.0000 | ORAL_TABLET | ORAL | Status: DC | PRN

## 2016-02-07 MED ORDER — HYDROMORPHONE HCL 1 MG/ML IJ SOLN
1.0000 mg | Freq: Once | INTRAMUSCULAR | Status: AC
  Administered 2016-02-07: 1 mg via INTRAMUSCULAR
  Filled 2016-02-07: qty 1

## 2016-02-07 MED ORDER — HYDROMORPHONE HCL 1 MG/ML IJ SOLN
1.0000 mg | Freq: Once | INTRAMUSCULAR | Status: AC
  Administered 2016-02-07: 1 mg via INTRAVENOUS
  Filled 2016-02-07: qty 1

## 2016-02-07 NOTE — Discharge Instructions (Signed)
Back Exercises The following exercises strengthen the muscles that help to support the back. They also help to keep the lower back flexible. Doing these exercises can help to prevent back pain or lessen existing pain. If you have back pain or discomfort, try doing these exercises 2-3 times each day or as told by your health care provider. When the pain goes away, do them once each day, but increase the number of times that you repeat the steps for each exercise (do more repetitions). If you do not have back pain or discomfort, do these exercises once each day or as told by your health care provider. EXERCISES Single Knee to Chest Repeat these steps 3-5 times for each leg:  Lie on your back on a firm bed or the floor with your legs extended.  Bring one knee to your chest. Your other leg should stay extended and in contact with the floor.  Hold your knee in place by grabbing your knee or thigh.  Pull on your knee until you feel a gentle stretch in your lower back.  Hold the stretch for 10-30 seconds.  Slowly release and straighten your leg. Pelvic Tilt Repeat these steps 5-10 times:  Lie on your back on a firm bed or the floor with your legs extended.  Bend your knees so they are pointing toward the ceiling and your feet are flat on the floor.  Tighten your lower abdominal muscles to press your lower back against the floor. This motion will tilt your pelvis so your tailbone points up toward the ceiling instead of pointing to your feet or the floor.  With gentle tension and even breathing, hold this position for 5-10 seconds. Cat-Cow Repeat these steps until your lower back becomes more flexible:  Get into a hands-and-knees position on a firm surface. Keep your hands under your shoulders, and keep your knees under your hips. You may place padding under your knees for comfort.  Let your head hang down, and point your tailbone toward the floor so your lower back becomes rounded like the  back of a cat.  Hold this position for 5 seconds.  Slowly lift your head and point your tailbone up toward the ceiling so your back forms a sagging arch like the back of a cow.  Hold this position for 5 seconds. Press-Ups Repeat these steps 5-10 times:  Lie on your abdomen (face-down) on the floor.  Place your palms near your head, about shoulder-width apart.  While you keep your back as relaxed as possible and keep your hips on the floor, slowly straighten your arms to raise the top half of your body and lift your shoulders. Do not use your back muscles to raise your upper torso. You may adjust the placement of your hands to make yourself more comfortable.  Hold this position for 5 seconds while you keep your back relaxed.  Slowly return to lying flat on the floor. Bridges Repeat these steps 10 times:  Lie on your back on a firm surface.  Bend your knees so they are pointing toward the ceiling and your feet are flat on the floor.  Tighten your buttocks muscles and lift your buttocks off of the floor until your waist is at almost the same height as your knees. You should feel the muscles working in your buttocks and the back of your thighs. If you do not feel these muscles, slide your feet 1-2 inches farther away from your buttocks.  Hold this position for 3-5  seconds.  Slowly lower your hips to the starting position, and allow your buttocks muscles to relax completely. If this exercise is too easy, try doing it with your arms crossed over your chest. Abdominal Crunches Repeat these steps 5-10 times:  Lie on your back on a firm bed or the floor with your legs extended.  Bend your knees so they are pointing toward the ceiling and your feet are flat on the floor.  Cross your arms over your chest.  Tip your chin slightly toward your chest without bending your neck.  Tighten your abdominal muscles and slowly raise your trunk (torso) high enough to lift your shoulder blades a  tiny bit off of the floor. Avoid raising your torso higher than that, because it can put too much stress on your low back and it does not help to strengthen your abdominal muscles.  Slowly return to your starting position. Back Lifts Repeat these steps 5-10 times: 1. Lie on your abdomen (face-down) with your arms at your sides, and rest your forehead on the floor. 2. Tighten the muscles in your legs and your buttocks. 3. Slowly lift your chest off of the floor while you keep your hips pressed to the floor. Keep the back of your head in line with the curve in your back. Your eyes should be looking at the floor. 4. Hold this position for 3-5 seconds. 5. Slowly return to your starting position. SEEK MEDICAL CARE IF:  Your back pain or discomfort gets much worse when you do an exercise.  Your back pain or discomfort does not lessen within 2 hours after you exercise. If you have any of these problems, stop doing these exercises right away. Do not do them again unless your health care provider says that you can. SEEK IMMEDIATE MEDICAL CARE IF:  You develop sudden, severe back pain. If this happens, stop doing the exercises right away. Do not do them again unless your health care provider says that you can.   This information is not intended to replace advice given to you by your health care provider. Make sure you discuss any questions you have with your health care provider.   Document Released: 01/20/2005 Document Revised: 09/03/2015 Document Reviewed: 02/06/2015 [employer] Interactive Patient Education 2016 [employer] Back Injury Prevention Back injuries can be very painful. They can also be difficult to heal. After having one back injury, you are more likely to injure your back again. It is important to learn how to avoid injuring or re-injuring your back. The following tips can help you to prevent a back injury. WHAT SHOULD I KNOW ABOUT PHYSICAL FITNESS?  Exercise for 30 minutes per day  on most days of the week or as directed by your health care provider. Make sure to:  Do aerobic exercises, such as walking, jogging, biking, or swimming.  Do exercises that increase balance and strength, such as tai chi and yoga. These can decrease your risk of falling and injuring your back.  Do stretching exercises to help with flexibility.  Try to develop strong abdominal muscles. Your abdominal muscles provide a lot of the support that is needed by your back.  Maintain a healthy weight. This helps to decrease your risk of a back injury. WHAT SHOULD I KNOW ABOUT MY DIET?  Talk with your health care provider about your overall diet. Take supplements and vitamins only as directed by your health care provider.  Talk with your health care provider about how much calcium and vitamin   D you need each day. These nutrients help to prevent weakening of the bones (osteoporosis). Osteoporosis can cause broken (fractured) bones, which lead to back pain.  Include good sources of calcium in your diet, such as dairy products, green leafy vegetables, and products that have had calcium added to them (fortified).  Include good sources of vitamin D in your diet, such as milk and foods that are fortified with vitamin D. WHAT SHOULD I KNOW ABOUT MY POSTURE?  Sit up straight and stand up straight. Avoid leaning forward when you sit or hunching over when you stand.  Choose chairs that have good low-back (lumbar) support.  If you work at a desk, sit close to it so you do not need to lean over. Keep your chin tucked in. Keep your neck drawn back, and keep your elbows bent at a right angle. Your arms should look like the letter "L."  Sit high and close to the steering wheel when you drive. Add a lumbar support to your car seat, if needed.  Avoid sitting or standing in one position for very long. Take breaks to get up, stretch, and walk around at least one time every hour. Take breaks every hour if you are  driving for long periods of time.  Sleep on your side with your knees slightly bent, or sleep on your back with a pillow under your knees. Do not lie on the front of your body to sleep. WHAT SHOULD I KNOW ABOUT LIFTING, TWISTING, AND REACHING? Lifting and Heavy Lifting  Avoid heavy lifting, especially repetitive heavy lifting. If you must do heavy lifting:  Stretch before lifting.  Work slowly.  Rest between lifts.  Use a tool such as a cart or a dolly to move objects if one is available.  Make several small trips instead of carrying one heavy load.  Ask for help when you need it, especially when moving big objects.  Follow these steps when lifting:  Stand with your feet shoulder-width apart.  Get as close to the object as you can. Do not try to pick up a heavy object that is far from your body.  Use handles or lifting straps if they are available.  Bend at your knees. Squat down, but keep your heels off the floor.  Keep your shoulders pulled back, your chin tucked in, and your back straight.  Lift the object slowly while you tighten the muscles in your legs, abdomen, and buttocks. Keep the object as close to the center of your body as possible.  Follow these steps when putting down a heavy load:  Stand with your feet shoulder-width apart.  Lower the object slowly while you tighten the muscles in your legs, abdomen, and buttocks. Keep the object as close to the center of your body as possible.  Keep your shoulders pulled back, your chin tucked in, and your back straight.  Bend at your knees. Squat down, but keep your heels off the floor.  Use handles or lifting straps if they are available. Twisting and Reaching  Avoid lifting heavy objects above your waist.  Do not twist at your waist while you are lifting or carrying a load. If you need to turn, move your feet.  Do not bend over without bending at your knees.  Avoid reaching over your head, across a table, or  for an object on a high surface. WHAT ARE SOME OTHER TIPS?  Avoid wet floors and icy ground. Keep sidewalks clear of ice to prevent falls.    falls.  Do not sleep on a mattress that is too soft or too hard.  Keep items that are used frequently within easy reach.  Put heavier objects on shelves at waist level, and put lighter objects on lower or higher shelves.  Find ways to decrease your stress, such as exercise, massage, or relaxation techniques. Stress can build up in your muscles. Tense muscles are more vulnerable to injury.  Talk with your health care provider if you feel anxious or depressed. These conditions can make back pain worse.  Wear flat heel shoes with cushioned soles.  Avoid sudden movements.  Use both shoulder straps when carrying a backpack.  Do not use any tobacco products, including cigarettes, chewing tobacco, or electronic cigarettes. If you need help quitting, ask your health care provider.   This information is not intended to replace advice given to you by your health care provider. Make sure you discuss any questions you have with your health care provider.  Follow up with orthopedic provider for re-evaluation. Take pain medication as prescribed. Apply ice to affected area. Return to the ED if you experience worsening of your symptoms, bowel or bladder incontinence, numbness or tingling in your extremities.

## 2016-02-07 NOTE — ED Notes (Signed)
Per EMS, pt tripped and fell down 6 stairs at work today. Pt complains of 10/10 pain his mid back radiating to tailbone. Pt denies loss of consciousness or head injury. Pt is not on blood thinners. Pt dx'd with torn ligament in right arm 2 weeks ago, right arm is in sling. Pt took hydrocodone at 9AM today for right arm injury.

## 2016-02-07 NOTE — ED Notes (Signed)
Bed: WA19 Expected date:  Expected time:  Means of arrival:  Comments: EMS/Fall 

## 2016-02-07 NOTE — ED Notes (Signed)
Pt states he is unable to ambulate due to back pain. EDPA notified.

## 2016-02-07 NOTE — ED Provider Notes (Signed)
CSN: [id number]     Arrival date & time 02/07/16  1209 History   First MD Initiated Contact with Patient 02/07/16 1226     Chief Complaint  Patient presents with  . Fall  . Back Pain     (Consider location/radiation/quality/duration/timing/severity/associated sxs/prior Treatment) HPI   Harold Anderson is a 25 y.o M with no significant pmhx who presents to the ED today c/o back pain. Pt states that he was at work when he stepped backwards and tripped over a hose and fell backwards down 6 stairs. Pt landed directly on his lower back which is where is has significant pain now. No head injury of LOC. No neck pain. No sensory deficits. Pt able to move all extremities. No bowel/bladder incontinence or saddle anesthesia. No meds given PTA. Pt has not ambulated since injury.   Past Medical History  Diagnosis Date  . Headache(784.0)   . Reflux    Past Surgical History  Procedure Laterality Date  . Tonsillectomy     Family History  Problem Relation Age of Onset  . Heart failure Other    Social History  Substance Use Topics  . Smoking status: Former Smoker    Quit date: 08/08/2012  . Smokeless tobacco: None  . Alcohol Use: No    Review of Systems  All other systems reviewed and are negative.     Allergies  Azithromycin  Home Medications   Prior to Admission medications   Medication Sig Start Date End Date Taking? Authorizing Provider  HYDROcodone-acetaminophen (NORCO/VICODIN) 5-325 MG tablet Take 1 tablet by mouth every 6 (six) hours as needed for moderate pain.   Yes Historical Provider, MD   BP 116/72 mmHg  Pulse 79  Temp(Src) 98.1 F (36.7 C) (Oral)  Resp 18  SpO2 98% Physical Exam  Constitutional: He is oriented to person, place, and time. He appears well-developed and well-nourished. No distress.  HENT:  Head: Normocephalic and atraumatic.  Mouth/Throat: No oropharyngeal exudate.  Eyes: Conjunctivae and EOM are normal. Pupils are equal, round, and reactive to  light. Right eye exhibits no discharge. Left eye exhibits no discharge. No scleral icterus.  Cardiovascular: Normal rate, regular rhythm, normal heart sounds and intact distal pulses.  Exam reveals no gallop and no friction rub.   No murmur heard. Pulmonary/Chest: Effort normal and breath sounds normal. No respiratory distress. He has no wheezes. He has no rales. He exhibits no tenderness.  Abdominal: Soft. He exhibits no distension. There is no tenderness. There is no guarding.  Musculoskeletal: Normal range of motion. He exhibits no edema.  Midline spinal tenderness to thoracic, lumbar spine and sacrum. Pain increased with rotation, but no decrease ROM. No hip or pelvic TTP. No cervical spinal tenderness. No step offs or obvious bony deformity. Pt able to move all extremities without difficulty.   Neurological: He is alert and oriented to person, place, and time.  Strength 5/5 throughout. No sensory deficits.  Pt able to ambulate, but states is painful.   Skin: Skin is warm and dry. No rash noted. He is not diaphoretic. No erythema. No pallor.  Psychiatric: He has a normal mood and affect. His behavior is normal.  Nursing note and vitals reviewed.   ED Course  Procedures (including critical care time) Labs Review Labs Reviewed - No data to display  Imaging Review Dg Thoracic Spine 2 View  02/07/2016  CLINICAL DATA:  Pain following fall down stairs EXAM: THORACIC SPINE 2 VIEWS COMPARISON:  None. FINDINGS: Frontal and lateral views  were obtained. There is mild upper thoracic levoscoliosis. There is no demonstrable fracture or spondylolisthesis. The disc spaces appear normal. There is no erosive change or paraspinous lesion. IMPRESSION: Mild scoliosis. No fracture or spondylolisthesis. No appreciable arthropathy. Electronically Signed   By: Bretta Bang III M.D.   On: 02/07/2016 13:22   Dg Lumbar Spine Complete  02/07/2016  CLINICAL DATA:  Pain following fall down stairs EXAM: LUMBAR  SPINE - COMPLETE 4+ VIEW COMPARISON:  None. FINDINGS: Frontal, lateral, spot lumbosacral lateral, and bilateral oblique views were obtained. There are 5 non-rib-bearing lumbar type vertebral bodies. There is no demonstrable fracture or spondylolisthesis. The disc spaces appear normal. There is no appreciable facet arthropathy. IMPRESSION: No fracture or spondylolisthesis. No appreciable arthropathic change. Electronically Signed   By: Bretta Bang III M.D.   On: 02/07/2016 13:23   Dg Sacrum/coccyx  02/07/2016  CLINICAL DATA:  Pain following fall down stairs EXAM: SACRUM AND COCCYX - 2+ VIEW COMPARISON:  None. FINDINGS: Frontal and lateral views were obtained. There is no fracture or diastases. Joint spaces appear intact. No erosive change. IMPRESSION: No fracture or diastases.  No appreciable arthropathy. Electronically Signed   By: Bretta Bang III M.D.   On: 02/07/2016 13:24   I have personally reviewed and evaluated these images and lab results as part of my medical decision-making.   EKG Interpretation None      MDM   Final diagnoses:  Fall, initial encounter  Back injury, initial encounter    Patient with back pain after a traumatic fall down 6 stairs.  No neurological deficits and normal neuro exam. No loss of bowel or bladder control.  No concern for cauda equina. Xray negative for fracture. NO head injury or LOC. C-spine cleared with Nexus criteria. Pt originally did not want to ambulate due to pain. Pain managed. Pt was then able to ambulate in room without assistance.  RICE protocol and pain medicine indicated and discussed with patient. Pt will follow up with ortho.  Case discussed with Dr. Radford Pax.     Lester Kinsman [place], PA-C 02/08/16 4098  Nelva Nay, MD 02/23/16 1345

## 2016-02-21 IMAGING — CR DG SACRUM/COCCYX 2+V
3 series · 3 of 3 positions shown · non-contrast
Comparison: None.

CLINICAL DATA: Pain following fall down stairs

EXAM:
SACRUM AND COCCYX - 2+ VIEW

[t sacrum coccyx lat]
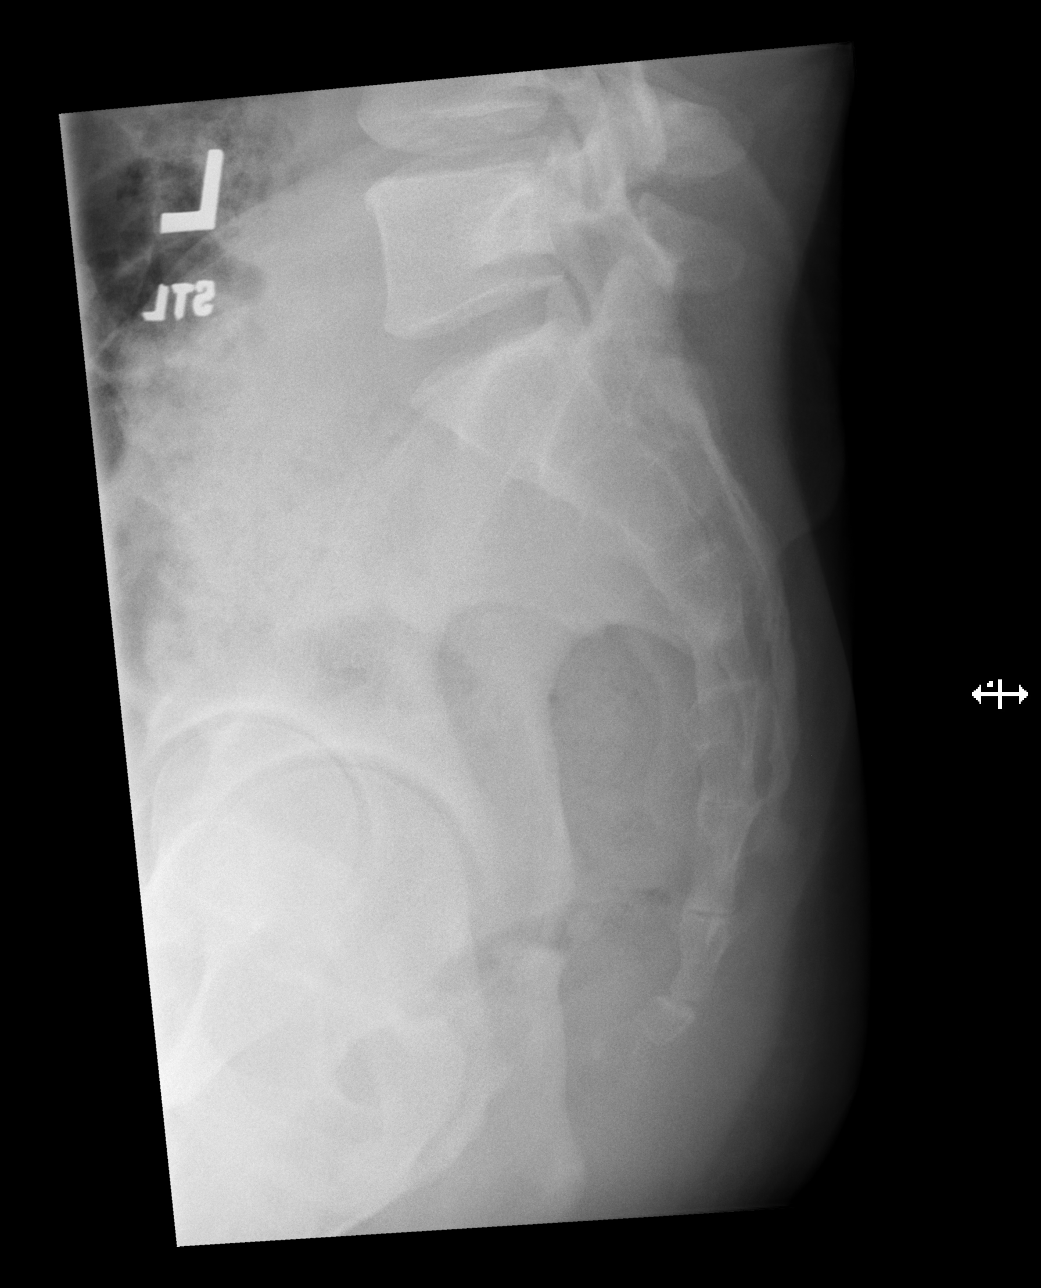

[t sacrum ap]
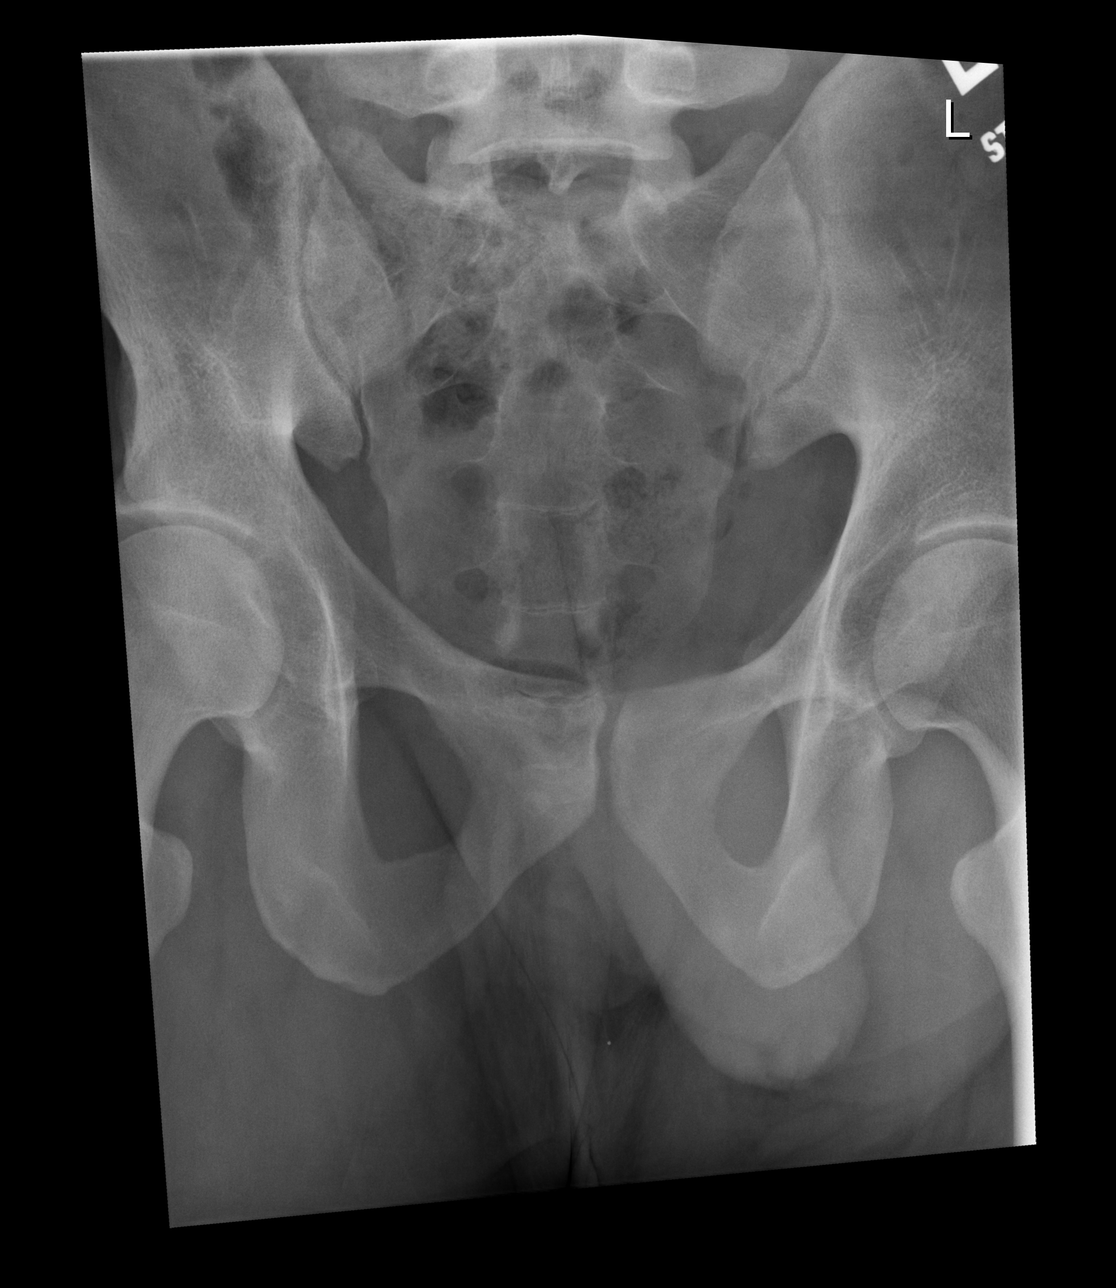

[t coccyx ap]
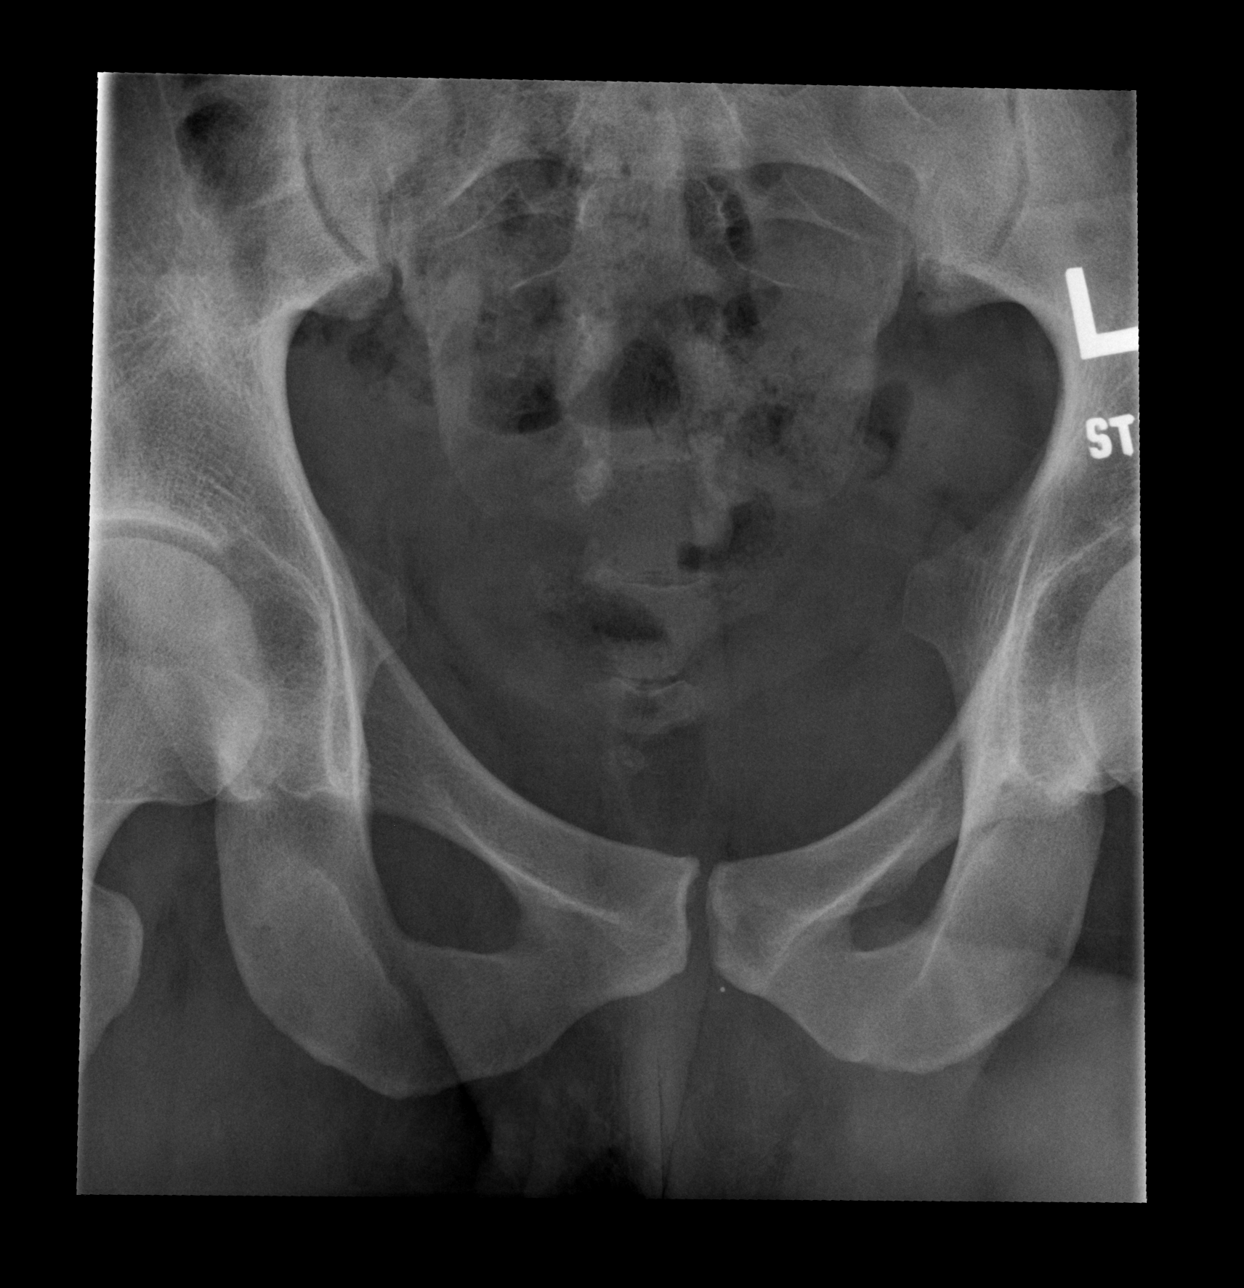

[3 of 3 positions shown; findings below may reference images not displayed]

FINDINGS: Frontal and lateral views were obtained. There is no fracture or
diastases. Joint spaces appear intact. No erosive change.
IMPRESSION: No fracture or diastases.  No appreciable arthropathy.

## 2016-03-10 ENCOUNTER — Ambulatory Visit (INDEPENDENT_AMBULATORY_CARE_PROVIDER_SITE_OTHER): Payer: Self-pay | Admitting: Physician Assistant

## 2016-03-10 VITALS — BP 120/80 | HR 75 | Temp 98.6°F | Resp 17 | Ht 70.0 in | Wt 188.0 lb

## 2016-03-10 DIAGNOSIS — Z0289 Encounter for other administrative examinations: Secondary | ICD-10-CM

## 2016-03-10 DIAGNOSIS — Z021 Encounter for pre-employment examination: Secondary | ICD-10-CM

## 2016-03-10 NOTE — Patient Instructions (Signed)
     IF you received an x-ray today, you will receive an invoice from [facility]. Please contact [facility] at [phone] with questions or concerns regarding your invoice.   IF you received labwork today, you will receive an invoice from [facility]. Please contact [facility] at [phone] with questions or concerns regarding your invoice.   Our billing staff will not be able to assist you with questions regarding bills from these companies.  You will be contacted with the lab results as soon as they are available. The fastest way to get your results is to activate your My Chart account. Instructions are located on the last page of this paperwork. If you have not heard from us regarding the results in 2 weeks, please contact this office.      

## 2016-03-10 NOTE — Progress Notes (Signed)
Commercial Driver Medical Examination   Harold Anderson is a 25 y.o. male who presents today for a commercial driver fitness determination physical exam. The patient reports no problems. The following portions of the patient's history were reviewed and updated as appropriate: allergies, current medications, past family history, past medical history, past social history, past surgical history and problem list.  No PMH. No regular medications. No surghx. No tobacco or alcohol use.  He is not currently driving. He is taking classes to get certified and then he will be looking for a job.  Review of Systems Pertinent items are noted in HPI.   Objective:    Vision/hearing:  Visual Acuity Screening   Right eye Left eye Both eyes  Without correction: 20/20 20/20 20/20   With correction:     Hearing Screening Comments: Peripheral Vision: Right eye 85 degrees. Left eye 85 degrees. The patient can distinguish the colors red, amber and green. The patient was able to hear a forced whisper from L=10, R=10 feet.  Applicant can recognize and distinguish among traffic control signals and devices showing standard red, green, and amber colors.  Corrective lenses required: No  Monocular Vision?: No  Hearing aid requirement: No  Physical Exam  Constitutional: He is oriented to person, place, and time. He appears well-developed and well-nourished.  HENT:  Head: Normocephalic and atraumatic.  Right Ear: Hearing, tympanic membrane, external ear and ear canal normal.  Left Ear: Hearing, tympanic membrane, external ear and ear canal normal.  Nose: Nose normal.  Mouth/Throat: Uvula is midline, oropharynx is clear and moist and mucous membranes are normal.  Eyes: Conjunctivae, EOM and lids are normal. Right eye exhibits no discharge. Left eye exhibits no discharge. No scleral icterus.  Neck: Trachea normal and normal range of motion. Neck supple. Carotid bruit is not present. No thyromegaly present.   Cardiovascular: Normal rate, regular rhythm, normal heart sounds, intact distal pulses and normal pulses.   No murmur heard. Pulmonary/Chest: Effort normal and breath sounds normal. No respiratory distress. He has no wheezes. He has no rhonchi. He has no rales.  Abdominal: Soft. Normal appearance. There is no tenderness.  Musculoskeletal: Normal range of motion. He exhibits no edema or tenderness.  Lymphadenopathy:       Head (right side): No submental, no submandibular and no tonsillar adenopathy present.       Head (left side): No submental, no submandibular and no tonsillar adenopathy present.    He has no cervical adenopathy.  Neurological: He is alert and oriented to person, place, and time. He has normal strength and normal reflexes. No cranial nerve deficit. Coordination and gait normal.  Skin: Skin is warm, dry and intact. No lesion and no rash noted.  Psychiatric: He has a normal mood and affect. His speech is normal and behavior is normal. Judgment and thought content normal.   BP 120/80 mmHg  Pulse 75  Temp(Src) 98.6 F (37 C) (Oral)  Resp 17  Ht 5\' 10"  (1.778 m)  Wt 188 lb (85.276 kg)  BMI 26.98 kg/m2  SpO2 98%  Labs: Comments: spgr:1.015, PRO:neg,Blood:neg:sugar:neg   Assessment:    Healthy male exam.  Meets standards in 7149 CFR 391.41;  qualifies for 2 year certificate.    Plan:    Medical examiners certificate completed and printed. Return as needed.

## 2018-05-08 ENCOUNTER — Ambulatory Visit: Payer: Self-pay | Admitting: Physician Assistant

## 2021-02-09 ENCOUNTER — Emergency Department (HOSPITAL_COMMUNITY)
Admission: EM | Admit: 2021-02-09 | Discharge: 2021-02-09 | Disposition: A | Discharge status: Home / Self Care | Payer: Self-pay | Attending: Emergency Medicine | Admitting: Emergency Medicine

## 2021-02-09 ENCOUNTER — Emergency Department (HOSPITAL_COMMUNITY): Payer: Self-pay

## 2021-02-09 DIAGNOSIS — R350 Frequency of micturition: Secondary | ICD-10-CM | POA: Insufficient documentation

## 2021-02-09 DIAGNOSIS — N5082 Scrotal pain: Secondary | ICD-10-CM | POA: Insufficient documentation

## 2021-02-09 LAB — URINALYSIS, ROUTINE W REFLEX MICROSCOPIC
Bilirubin Urine: NEGATIVE
Glucose, UA: NEGATIVE mg/dL
Hgb urine dipstick: NEGATIVE
Ketones, ur: NEGATIVE mg/dL
Leukocytes,Ua: NEGATIVE
Nitrite: NEGATIVE
Protein, ur: NEGATIVE mg/dL
Specific Gravity, Urine: 1.009 (ref 1.005–1.030)
pH: 7 (ref 5.0–8.0)

## 2021-02-09 LAB — CBG MONITORING, ED: Glucose-Capillary: 104 mg/dL — ABNORMAL HIGH (ref 70–99)

## 2021-02-09 LAB — GC/CHLAMYDIA PROBE AMP (~~LOC~~) NOT AT ARMC
Chlamydia: NEGATIVE
Comment: NEGATIVE
Comment: NORMAL
Neisseria Gonorrhea: NEGATIVE

## 2021-02-09 NOTE — ED Triage Notes (Signed)
Pt states that he has been experiencing urinary frequency and pressure in his penis. No discharge. Feels like he has to "pee all the time." Pt requests STD testing.

## 2021-02-09 NOTE — ED Provider Notes (Signed)
[facility]-EMERGENCY DEPT Provider Note   CSN: [id number] Arrival date & time: 02/09/21  5784     History Chief Complaint  Patient presents with  . Exposure to STD    Cyril Woodmansee is a 30 y.o. male with history of GERD who presents emergency department with a chief complaint of dysuria.  The patient reports dysuria, urinary frequency, left-sided testicular pain that began over the last 24 hours.  He characterizes the left testicular pain is intermittent and aching.  No known aggravating or alleviating factors.  He denies fever, chills, flank pain, abdominal pain, back pain, chest pain, shortness of breath, diarrhea, constipation, nausea, or vomiting.  He is uncircumcised.  He reports that he has been using a penis pump (vacuum constriction device) for the last 4 months.  No recent changes to this regimen.  He reports that he had unprotected sex with a new sexual partner for the first time 4 days ago.  The history is provided by the patient and medical records. No language interpreter was used.       Past Medical History:  Diagnosis Date  . Headache(784.0)   . Reflux     There are no problems to display for this patient.   Past Surgical History:  Procedure Laterality Date  . TONSILLECTOMY         Family History  Problem Relation Age of Onset  . Heart failure Other     Social History   Tobacco Use  . Smoking status: Former Smoker    Quit date: 08/08/2012    Years since quitting: 8.5  Substance Use Topics  . Alcohol use: No  . Drug use: No    Home Medications Prior to Admission medications   Medication Sig Start Date End Date Taking? Authorizing Provider  OMEPRAZOLE PO Take by mouth.  11/08/12  [provider]    Allergies    Azithromycin  Review of Systems   Review of Systems  Constitutional: Negative for appetite change and fever.  HENT: Negative for congestion and sore throat.   Respiratory: Negative for shortness  of breath.   Cardiovascular: Negative for chest pain.  Gastrointestinal: Negative for abdominal pain, diarrhea, nausea and vomiting.  Genitourinary: Positive for dysuria, frequency and testicular pain. Negative for decreased urine volume, difficulty urinating, flank pain, hematuria, penile discharge, penile pain, penile swelling, scrotal swelling and urgency.  Musculoskeletal: Negative for back pain, myalgias, neck pain and neck stiffness.  Skin: Negative for rash and wound.  Allergic/Immunologic: Negative for immunocompromised state.  Neurological: Negative for dizziness, seizures, syncope, weakness, light-headedness, numbness and headaches.  Psychiatric/Behavioral: Negative for confusion.    Physical Exam Updated Vital Signs BP (!) 153/66 (BP Location: Right Arm)   Pulse 86   Temp 98.7 F (37.1 C) (Oral)   Resp 18   Ht 5\' 10"  (1.778 m)   Wt 85.3 kg   SpO2 100%   BMI 26.98 kg/m   Physical Exam Vitals and nursing note reviewed.  Constitutional:      General: He is not in acute distress.    Appearance: He is well-developed. He is not ill-appearing, toxic-appearing or diaphoretic.  HENT:     Head: Normocephalic.  Eyes:     Conjunctiva/sclera: Conjunctivae normal.  Cardiovascular:     Rate and Rhythm: Normal rate and regular rhythm.     Pulses: Normal pulses.     Heart sounds: Normal heart sounds. No murmur heard. No friction rub. No gallop.   Pulmonary:  Effort: Pulmonary effort is normal. No respiratory distress.     Breath sounds: No stridor. No wheezing, rhonchi or rales.  Chest:     Chest wall: No tenderness.  Abdominal:     General: There is no distension.     Palpations: Abdomen is soft. There is no mass.     Tenderness: There is no abdominal tenderness. There is no right CVA tenderness, left CVA tenderness, guarding or rebound.     Hernia: No hernia is present.  Genitourinary:    Comments: Chaperoned exam.  Tender to palpation to the left scrotum.  Mild  swelling.  No redness or warmth.,  Static reached flex is intact bilaterally.  No inguinal lymphadenopathy.  No swelling, redness, tenderness, or warmth noted to the right scrotum.  Uncircumcised penis.  No rashes noted in the GU area.  Glans penis is unremarkable. Musculoskeletal:     Cervical back: Neck supple.     Right lower leg: No edema.     Left lower leg: No edema.  Skin:    General: Skin is warm and dry.     Coloration: Skin is not jaundiced or pale.  Neurological:     Mental Status: He is alert.  Psychiatric:        Behavior: Behavior normal.     ED Results / Procedures / Treatments   Labs (all labs ordered are listed, but only abnormal results are displayed) Labs Reviewed  URINALYSIS, ROUTINE W REFLEX MICROSCOPIC - Abnormal; Notable for the following components:      Result Value   Color, Urine AMBER (*)    All other components within normal limits  CBG MONITORING, ED - Abnormal; Notable for the following components:   Glucose-Capillary 104 (*)    All other components within normal limits  URINE CULTURE  GC/CHLAMYDIA PROBE AMP ([place]) NOT AT [facility]    EKG None  Radiology US SCROTUM W/DOPPLER  Result Date: 02/09/2021 CLINICAL DATA:  Left testicle pain today EXAM: SCROTAL ULTRASOUND DOPPLER ULTRASOUND OF THE TESTICLES TECHNIQUE: Complete ultrasound examination of the testicles, epididymis, and other scrotal structures was performed. Color and spectral Doppler ultrasound were also utilized to evaluate blood flow to the testicles. COMPARISON:  None. FINDINGS: Right testicle Measurements: 55 x 38 x 32 mm.  No mass or abnormal vascularity. Left testicle Measurements:  59 x 28 x 37 mm.  No mass or abnormal vascularity. Right epididymis:  Normal in size and vascularity. Left epididymis:  Normal in size and vascularity. Hydrocele:  None visualized. Varicocele: Prominent number of vessels on the left but no dilatation to reach threshold for varicocele. Pulsed Doppler  interrogation of both testes demonstrates normal low resistance arterial and venous waveforms bilaterally. IMPRESSION: Negative scrotal ultrasound. Electronically Signed   By: Marnee Spring M.D.   On: 02/09/2021 04:56    Procedures Procedures   Medications Ordered in ED Medications - No data to display  ED Course  I have reviewed the triage vital signs and the nursing notes.  Pertinent labs & imaging results that were available during my care of the patient were reviewed by me and considered in my medical decision making (see chart for details).    MDM Rules/Calculators/A&P                          30 year old male with history of GERD presenting with a 1 day history of left-sided testicular pain, urinary frequency and pressure.  Patient does report that he  had unprotected sexual intercourse with a new partner 4 days ago.  Vital signs are stable.  Differential diagnosis includes epididymitis, testicular abscess, UTI, obstructive uropathy, balanitis, urethritis, testicular torsion, hyperglycemia, DKA, HHS, constipation.  Labs and imaging have been reviewed and independently interpreted by me.  Given concern for polyuria, CBG was obtained and was 104.  UA is not concerning for infection.  However, given concern the patient was having new left-sided testicular pain.  Testicular ultrasound was obtained and did demonstrate some prominent vessels on the left, but no dilatation to reach the threshold for varicocele.  I did discuss with the patient that this could be secondary to the vacuum penile pump that he has been using for the last 4 months.  Advised the patient to discontinue this device and see if symptoms improve.  Will provide patient with a referral to [facility] health and wellness.  He is aware that urine cultures are pending and will be contacted if they are positive.  He has also been advised to follow-up with the [facility] department for routine STI testing.  He is  hemodynamically stable to no acute distress.  Safe for discharge home with outpatient follow-up as indicated.  Final Clinical Impression(s) / ED Diagnoses Final diagnoses:  Scrotal pain  Increased urinary frequency    Rx / DC Orders ED Discharge Orders    None       Barkley Boards, PA-C 02/09/21 0822    Palumbo, April, MD 02/09/21 2305

## 2021-02-09 NOTE — Discharge Instructions (Signed)
Your work-up today was reassuring.  There was no evidence of infection in your urine.  Sometimes we consider high blood sugar as a reason that you might be urinating frequently.  Your blood sugar was reassuring today.  We ordered a scrotal ultrasound since you are having some left testicular pain, but this was also reassuring.  You can follow-up with [facility] department for routine STD testing.  You can call and schedule a follow-up appointment with [name] and wellness if you continue to have urinary frequency.  Your urine culture is pending.  If it is positive, you should receive a call from the hospital.  Return to the emergency department if you develop significant swelling, redness, and warmth to the scrotum, if you become unable to produce urine, if you have blood in your urine, severe abdominal pain, uncontrollable vomiting, high fevers with worsening urinary discomfort, or other new, concerning symptoms.

## 2021-02-10 LAB — URINE CULTURE: Culture: NO GROWTH

## 2023-10-17 ENCOUNTER — Ambulatory Visit (INDEPENDENT_AMBULATORY_CARE_PROVIDER_SITE_OTHER): Payer: Medicaid Other | Admitting: Urology

## 2023-10-17 ENCOUNTER — Encounter: Payer: Self-pay | Admitting: Urology

## 2023-10-17 VITALS — BP 108/75 | HR 62 | Ht 70.0 in | Wt 174.0 lb

## 2023-10-17 DIAGNOSIS — Z3009 Encounter for other general counseling and advice on contraception: Secondary | ICD-10-CM

## 2023-10-17 MED ORDER — ALPRAZOLAM 1 MG PO TABS
ORAL_TABLET | ORAL | 0 refills | Status: DC
Start: 2023-10-17 — End: 2024-08-13

## 2023-10-17 NOTE — Progress Notes (Signed)
Assessment: 1. Encounter for vasectomy assessment    Plan: Schedule for vasectomy per patient request Rx for alprazolam 1 mg pre-procedure provided.  Chief Complaint:  Chief Complaint  Patient presents with   VAS Consult    History of Present Illness:  Harold Anderson is a 32 y.o. male who is seen for vasectomy evaluation. He is married with 4 children.  No history of scrotal trauma or infection.  Past Medical History:  Past Medical History:  Diagnosis Date   Headache(784.0)    Reflux     Past Surgical History:  Past Surgical History:  Procedure Laterality Date   TONSILLECTOMY      Allergies:  Allergies  Allergen Reactions   Azithromycin Other (See Comments)    Acid reflux    Family History:  Family History  Problem Relation Age of Onset   Heart failure Other     Social History:  Social History   Tobacco Use   Smoking status: Former    Current packs/day: 0.00    Types: Cigarettes    Quit date: 08/08/2012    Years since quitting: 11.1  Substance Use Topics   Alcohol use: No   Drug use: No    Review of symptoms:  Constitutional:  Negative for unexplained weight loss, night sweats, fever, chills ENT:  Negative for nose bleeds, sinus pain, painful swallowing CV:  Negative for chest pain, shortness of breath, exercise intolerance, palpitations, loss of consciousness Resp:  Negative for cough, wheezing, shortness of breath GI:  Negative for nausea, vomiting, diarrhea, bloody stools GU:  Positives noted in HPI; otherwise negative for gross hematuria, dysuria, urinary incontinence Neuro:  Negative for seizures, poor balance, limb weakness, slurred speech Psych:  Negative for lack of energy, depression, anxiety Endocrine:  Negative for polydipsia, polyuria, symptoms of hypoglycemia (dizziness, hunger, sweating) Hematologic:  Negative for anemia, purpura, petechia, prolonged or excessive bleeding, use of anticoagulants  Allergic:  Negative for difficulty  breathing or choking as a result of exposure to anything; no shellfish allergy; no allergic response (rash/itch) to materials, foods  Physical exam: BP 108/75 (BP Location: Left Arm, Patient Position: Standing, Cuff Size: Normal)   Pulse 62   Ht 5\' 10"  (1.778 m)   Wt 174 lb (78.9 kg)   BMI 24.97 kg/m  GENERAL APPEARANCE:  Well appearing, well developed, well nourished, NAD HEENT:  Atraumatic, normocephalic, oropharynx clear NECK:  Supple without lymphadenopathy or thyromegaly ABDOMEN:  Soft, non-tender, no masses EXTREMITIES:  Moves all extremities well, without clubbing, cyanosis, or edema NEUROLOGIC:  Alert and oriented x 3, normal gait, CN II-XII grossly intact MENTAL STATUS:  appropriate BACK:  Non-tender to palpation, No CVAT SKIN:  Warm, dry, and intact GU: Penis:  uncircumcised Meatus: Normal Scrotum: vas palpated bilaterally Testis: normal without masses bilateral Epididymis: normal  Results: None  VASECTOMY CONSULTATION  Harold Anderson presents for vasectomy consultation today.  He is a 32 y.o. male, Married with 4  children .  He and his wife have discussed the issues regarding long-term fertility and are comfortable with this decision.  He presents for consideration for vasectomy.  I discussed the issues in detail with him today and he expressed no reservations.  As to the procedure, no scalpel technique vasectomy is explained and reviewed in detail.  Generalized risks including but not limited to bleeding, infection, orchalgia, testicular atrophy, epididymitis, scrotal hematoma, and chronic pain are discussed.   Additionally, he understands that the possibility of vas recanalization following vasectomy is possible although rare.  Most importantly, the patient understands that he is not sterile initially and will need a semen analysis check to confirm sterility such that no sperm are seen.  He is advised to avoid ejaculation for 10 days following the procedure.  The  initial semen analysis will be checked in approximately 12 weeks and in some patients, several months may be required for clearance of all sperm.  He reports a clear understanding of the need for continued birth control until sterility is confirmed.  Otherwise, general issues regarding local anesthesia, prep, alprazolam are discussed and he reports a clear understanding.

## 2023-10-17 NOTE — Patient Instructions (Signed)

## 2023-11-18 ENCOUNTER — Ambulatory Visit (INDEPENDENT_AMBULATORY_CARE_PROVIDER_SITE_OTHER): Payer: Medicaid Other | Admitting: Urology

## 2023-11-18 ENCOUNTER — Encounter: Payer: Self-pay | Admitting: Urology

## 2023-11-18 VITALS — BP 110/75 | HR 86 | Ht 70.0 in | Wt 165.0 lb

## 2023-11-18 DIAGNOSIS — Z302 Encounter for sterilization: Secondary | ICD-10-CM | POA: Diagnosis not present

## 2023-11-18 MED ORDER — CEPHALEXIN 500 MG PO CAPS: 500.0000 mg | ORAL_CAPSULE | Freq: Three times a day (TID) | ORAL | 0 refills | Status: AC

## 2023-11-18 MED ORDER — HYDROCODONE-ACETAMINOPHEN 5-325 MG PO TABS
1.0000 | ORAL_TABLET | Freq: Four times a day (QID) | ORAL | 0 refills | Status: DC | PRN
Start: 2023-11-18 — End: 2023-11-23

## 2023-11-18 NOTE — Patient Instructions (Signed)

## 2023-11-18 NOTE — Progress Notes (Signed)
  Assessment: 1. Encounter for vasectomy     Plan: Post vasectomy instructions given Rx sent Post vasectomy semen analysis in 12 weeks  Chief Complaint:  Chief Complaint  Patient presents with   VAS    History of Present Illness:  Harold Anderson is a 32 y.o. male who is seen for vasectomy. He is married with 4 children.  No history of scrotal trauma or infection.  Past Medical History:  Past Medical History:  Diagnosis Date   Headache(784.0)    Reflux     Past Surgical History:  Past Surgical History:  Procedure Laterality Date   TONSILLECTOMY      Allergies:  Allergies  Allergen Reactions   Azithromycin Other (See Comments)    Acid reflux   Sulfa Antibiotics Rash    Family History:  Family History  Problem Relation Age of Onset   Heart failure Other     Social History:  Social History   Tobacco Use   Smoking status: Former    Current packs/day: 0.00    Types: Cigarettes    Quit date: 08/08/2012    Years since quitting: 11.2  Substance Use Topics   Alcohol use: No   Drug use: No    ROS: Constitutional:  Negative for fever, chills, weight loss CV: Negative for chest pain, previous MI, hypertension Respiratory:  Negative for shortness of breath, wheezing, sleep apnea, frequent cough GI:  Negative for nausea, vomiting, bloody stool, GERD  Physical exam: BP 110/75   Pulse 86   Ht 5\' 10"  (1.778 m)   Wt 165 lb (74.8 kg)   BMI 23.68 kg/m  GENERAL APPEARANCE:  Well appearing, well developed, well nourished, NAD HEENT:  Atraumatic, normocephalic, oropharynx clear NECK:  Supple without lymphadenopathy or thyromegaly ABDOMEN:  Soft, non-tender, no masses EXTREMITIES:  Moves all extremities well, without clubbing, cyanosis, or edema NEUROLOGIC:  Alert and oriented x 3, normal gait, CN II-XII grossly intact MENTAL STATUS:  appropriate BACK:  Non-tender to palpation, No CVAT SKIN:  Warm, dry, and intact  Results: None  VASECTOMY  PROCEDURE:  Harold Anderson presents for vasectomy following previous vasectomy consultation and permit is signed.  The patient's anterior scrotal wall is shaved and prepped with Betadine in standard sterile fashion.  1% lidocaine is used as local anesthetic in the scrotal and peri vasal tissue.  A standard median raphe punch incision is made and a no scalpel technique vasectomy is performed.  Bilateral vas are isolated from the peri vasal tissue and an approximately 1 cm segment of vas is excised.  Proximal and distal segments are internally cauterized with electric heat cautery. Interposition of perivasal tissue was performed.  Bilateral palpation confirms bilateral vasectomy defect and no significant bleeding or hematoma is identified.  Neosporin gauze dressing and a scrotal support are applied.    Disposition: Patient is discharged home with Rx for pain medication and antibiotics.  Patient is given routine vasectomy instructions.   Most importantly, he is instructed and cautioned again regarding the need for protected intercourse until such time that a single  negative semen analysis has been obtained.  The initial semen analysis will be checked in approximately 12  weeks.  The patient reports a clear understanding.  He will call with any interval questions or concerns.

## 2023-11-23 ENCOUNTER — Other Ambulatory Visit: Payer: Self-pay | Admitting: Urology

## 2023-11-23 ENCOUNTER — Telehealth: Payer: Self-pay | Admitting: Urology

## 2023-11-23 DIAGNOSIS — Z302 Encounter for sterilization: Secondary | ICD-10-CM

## 2023-11-23 MED ORDER — HYDROCODONE-ACETAMINOPHEN 5-325 MG PO TABS: 1.0000 | ORAL_TABLET | Freq: Four times a day (QID) | ORAL | 0 refills | Status: DC | PRN

## 2023-11-23 NOTE — Telephone Encounter (Signed)
Patient had a vasectomy on 11/18/23 and is asking for more pain medication. I advised him to try Ibuprofen as directed in his after visit summary. I let him know that the provider was in clinic and someone would return his call once they had a chance to review his chart. Marcelino Duster

## 2023-11-30 ENCOUNTER — Emergency Department (HOSPITAL_BASED_OUTPATIENT_CLINIC_OR_DEPARTMENT_OTHER): Payer: Medicaid Other

## 2023-11-30 ENCOUNTER — Other Ambulatory Visit: Payer: Self-pay

## 2023-11-30 ENCOUNTER — Encounter (HOSPITAL_BASED_OUTPATIENT_CLINIC_OR_DEPARTMENT_OTHER): Payer: Self-pay | Admitting: Emergency Medicine

## 2023-11-30 ENCOUNTER — Telehealth: Payer: Self-pay | Admitting: Urology

## 2023-11-30 ENCOUNTER — Emergency Department (HOSPITAL_BASED_OUTPATIENT_CLINIC_OR_DEPARTMENT_OTHER)
Admission: EM | Admit: 2023-11-30 | Discharge: 2023-11-30 | Disposition: A | Discharge status: Home / Self Care | Payer: Medicaid Other | Attending: Emergency Medicine | Admitting: Emergency Medicine

## 2023-11-30 DIAGNOSIS — N50812 Left testicular pain: Secondary | ICD-10-CM | POA: Diagnosis not present

## 2023-11-30 DIAGNOSIS — N50811 Right testicular pain: Secondary | ICD-10-CM | POA: Diagnosis present

## 2023-11-30 LAB — URINALYSIS, ROUTINE W REFLEX MICROSCOPIC
Bilirubin Urine: NEGATIVE
Glucose, UA: NEGATIVE mg/dL
Hgb urine dipstick: NEGATIVE
Ketones, ur: NEGATIVE mg/dL
Leukocytes,Ua: NEGATIVE
Nitrite: NEGATIVE
Protein, ur: NEGATIVE mg/dL
Specific Gravity, Urine: <=1.005 (ref 1.005–1.030)
pH: 6 (ref 5.0–8.0)

## 2023-11-30 MED ORDER — OXYCODONE-ACETAMINOPHEN 5-325 MG PO TABS
1.0000 | ORAL_TABLET | Freq: Once | ORAL | Status: AC
  Administered 2023-11-30: 2 via ORAL
  Filled 2023-11-30: qty 2

## 2023-11-30 MED ORDER — HYDROCODONE-ACETAMINOPHEN 5-325 MG PO TABS: 1.0000 | ORAL_TABLET | Freq: Four times a day (QID) | ORAL | 0 refills | Status: AC | PRN

## 2023-11-30 NOTE — ED Provider Notes (Signed)
[place] [facility] AT [facility] Provider Note   CSN: [id number] Arrival date & time: 11/30/23  1919     History  Chief Complaint  Patient presents with   Testicle Pain    Harold Anderson is a 32 y.o. male.  Patient with history of vasectomy on 11/22 with Dr. Pete Glatter presents today with complaints of testicle pain. He states that he had fully recovered from his vasectomy until yesterday when he did more activity than he had done since his surgery and fells like he overdid it. States that by the end of the day yesterday he was having worsening pain and throughout the night his pain worsened and has been persistent since then. He states that he is out of his narcotics and hasn't needed them in several days. Denies any direct trauma. He has been icing his testicles all day as this is the only thing that helps his symptoms. Denies fevers or chills. No nausea, vomiting, or diarrhea. He tried to reach out to his surgeon today but has not heard back.  The history is provided by the patient. No language interpreter was used.  Testicle Pain       Home Medications Prior to Admission medications   Medication Sig Start Date End Date Taking? Authorizing Provider  ALPRAZolam Prudy Feeler) 1 MG tablet Take 1 tablet by mouth 1 hour prior to procedure 10/17/23   Stoneking, Danford Bad., MD  gabapentin (NEURONTIN) 300 MG capsule Take by mouth. 01/28/23   [provider]  HYDROcodone-acetaminophen (NORCO/VICODIN) 5-325 MG tablet Take 1 tablet by mouth every 6 (six) hours as needed. 11/23/23   Stoneking, Danford Bad., MD  OMEPRAZOLE PO Take by mouth.  11/08/12  [provider]      Allergies    Azithromycin, Ibuprofen, Tramadol, and Sulfa antibiotics    Review of Systems   Review of Systems  Genitourinary:  Positive for testicular pain.  All other systems reviewed and are negative.   Physical Exam Updated Vital Signs BP (!) 139/93 (BP Location: Right Arm)    Pulse 97   Temp 98.2 F (36.8 C)   Resp 16   Ht 5\' 10"  (1.778 m)   Wt 74.8 kg   SpO2 99%   BMI 23.66 kg/m  Physical Exam Vitals and nursing note reviewed.  Constitutional:      General: He is not in acute distress.    Appearance: Normal appearance. He is normal weight. He is not ill-appearing, toxic-appearing or diaphoretic.  HENT:     Head: Normocephalic and atraumatic.  Cardiovascular:     Rate and Rhythm: Normal rate.  Pulmonary:     Effort: Pulmonary effort is normal. No respiratory distress.  Abdominal:     General: Abdomen is flat.     Palpations: Abdomen is soft.     Tenderness: There is no abdominal tenderness.  Genitourinary:    Comments: Generalized not localized tenderness to palpation of the bilateral testicles without obvious swelling or deformity. Cremasteric reflex intact bilaterally.  Musculoskeletal:        General: Normal range of motion.     Cervical back: Normal range of motion.  Skin:    General: Skin is warm and dry.  Neurological:     General: No focal deficit present.     Mental Status: He is alert.  Psychiatric:        Mood and Affect: Mood normal.        Behavior: Behavior normal.     ED Results /  Procedures / Treatments   Labs (all labs ordered are listed, but only abnormal results are displayed) Labs Reviewed  URINALYSIS, ROUTINE W REFLEX MICROSCOPIC    EKG None  Radiology US SCROTUM W/DOPPLER  Result Date: 11/30/2023 CLINICAL DATA:  Testicular pain, right greater than left. Vasectomy 11/23/23 EXAM: SCROTAL ULTRASOUND DOPPLER ULTRASOUND OF THE TESTICLES TECHNIQUE: Complete ultrasound examination of the testicles, epididymis, and other scrotal structures was performed. Color and spectral Doppler ultrasound were also utilized to evaluate blood flow to the testicles. COMPARISON:  02/09/21 FINDINGS: Right testicle Measurements: 6.2 x 2.9 x 3.7 cm. No mass or microlithiasis visualized. Left testicle Measurements: 5.9 x 3.0 x 2.6 cm. No mass  or microlithiasis visualized. Right epididymis:  Normal in size and appearance. Left epididymis:  3 mm left-sided epididymal cyst or spermatocele. Hydrocele:  None visualized. Varicocele: There are ectatic vessels seen along the left hemiscrotum. Diameter of only 2 mm but more than usually seen. These are more pronounced today than on the previous exam. Pulsed Doppler interrogation of both testes demonstrates normal low resistance arterial and venous waveforms bilaterally. There is also a hypoechoic area along the course of the left inguinal region measuring 12 mm. No abnormal blood flow on Doppler. With the patient's history of surgery this could be a small hematoma. Smaller area on the right side. Simple attention on short follow-up IMPRESSION: Preserved blood flow to the testicles on Doppler. Preserved echotexture. Hypoechoic areas along the inguinal regions, left-greater-than-right. With the patient's history this could represent small hematoma. Recommend short follow up in 6 weeks or sooner as clinically appropriate. Prominent inguinal vessels. Electronically Signed   By: Karen Kays M.D.   On: 11/30/2023 21:26    Procedures Procedures    Medications Ordered in ED Medications  oxyCODONE-acetaminophen (PERCOCET/ROXICET) 5-325 MG per tablet 1-2 tablet (2 tablets Oral Given 11/30/23 2251)    ED Course/ Medical Decision Making/ A&P                                 Medical Decision Making Amount and/or Complexity of Data Reviewed Labs: ordered. Radiology: ordered.  Risk Prescription drug management.   This patient is a 32 y.o. male who presents to the ED for concern of testicular pain, this involves an extensive number of treatment options, and is a complaint that carries with it a high risk of complications and morbidity. The emergent differential diagnosis prior to evaluation includes, but is not limited to,  epididymitis, orchitis, testicular torsion, UTI, Fournier's gangrene. This is not  an exhaustive differential.   Past Medical History / Co-morbidities / Social History:  has a past medical history of Headache(784.0) and Reflux.  Additional history: Chart reviewed. Pertinent results include: had uncomplicated vasectomy with Dr. Pete Glatter on 11/22  Physical Exam: Physical exam performed. The pertinent findings include: generalized tenderness throughout the bilateral testicles without focal areas of pain. No lesions, skin changes, or drainage  Lab Tests: I ordered, and personally interpreted labs.  The pertinent results include:  UA noninfectious   Imaging Studies: I ordered imaging studies including US scrotum. I independently visualized and interpreted imaging which showed   Preserved blood flow to the testicles on Doppler. Preserved echotexture.   Hypoechoic areas along the inguinal regions, left-greater-than-right. With the patient's history this could represent small hematoma. Recommend short follow up in 6 weeks or sooner as clinically appropriate.   Prominent inguinal vessels.  I agree with the radiologist interpretation.  Medications: I ordered medication including percocet  for pain. Reevaluation of the patient after these medicines showed that the patient improved. I have reviewed the patients home medicines and have made adjustments as needed.   Disposition: After consideration of the diagnostic results and the patients response to treatment, I feel that emergency department workup does not suggest an emergent condition requiring admission or immediate intervention beyond what has been performed at this time. The plan is: Discharge with close urology follow-up and return precautions.  Patient's workup is benign.  Physical exam reassuring.  Suspect patient's pain is due to the small hematoma found on the ultrasound.  I did inform them of this as well as the recommendations for close urology follow-up. Will send for a few doses of vicodin for pain.  PDMP  reviewed.  Patient advised not to drive or operate heavy machinery while taking this medication.  Evaluation and diagnostic testing in the emergency department does not suggest an emergent condition requiring admission or immediate intervention beyond what has been performed at this time.  Plan for discharge with close PCP follow-up.  Patient is understanding and amenable with plan, educated on red flag symptoms that would prompt immediate return.  Patient discharged in stable condition.  Findings and plan of care discussed with supervising physician Dr. Theresia Lo who is in agreement.    Final Clinical Impression(s) / ED Diagnoses Final diagnoses:  Pain in both testicles    Rx / DC Orders ED Discharge Orders          Ordered    HYDROcodone-acetaminophen (NORCO/VICODIN) 5-325 MG tablet  Every 6 hours PRN        11/30/23 2321          An After Visit Summary was printed and given to the patient.     Silva Bandy, PA-C 11/30/23 2325    Elayne Snare K, DO 12/01/23 1558

## 2023-11-30 NOTE — ED Notes (Signed)
Patient has a ride (his mother) who is in the waiting room.

## 2023-11-30 NOTE — ED Triage Notes (Signed)
Pt c/o bil testicular pain, worse on RT; started yesterday, much worse today; had a vasectomy on 11/27; c/o dysuria

## 2023-11-30 NOTE — Telephone Encounter (Signed)
Pt is in pain and it started yesterday. had Vas on 11/22. Was icing it yesterday as well. Not so much today. Wants to know what he should do.

## 2023-11-30 NOTE — Discharge Instructions (Signed)
As we discussed, your workup in the ER today was reassuring for acute findings.  Your ultrasound did not show any emergent concerns.  You do have a small hematoma which is likely the cause of your discomfort which could be residual from surgery or new.  I recommend reaching out to your neurologist tomorrow to discuss management of this.  I have given you pain medication for you to take as prescribed as needed for severe pain only.  Do not drive or operate heavy machinery while taking this medication as it can be sedating.  You may also take Tylenol/Motrin for additional symptomatic relief.  Return if development of any new or worsening symptoms.

## 2023-12-01 ENCOUNTER — Encounter: Payer: Self-pay | Admitting: Urology

## 2024-02-20 ENCOUNTER — Other Ambulatory Visit: Payer: Medicaid Other

## 2024-03-01 ENCOUNTER — Other Ambulatory Visit

## 2024-05-14 ENCOUNTER — Telehealth: Payer: Self-pay

## 2024-05-14 NOTE — Telephone Encounter (Signed)
-----   Message from Oda Bence sent at 05/14/2024  1:55 PM EDT ----- Please contact patient to remind him of need for post vasectomy semen analysis to confirm success of procedure.

## 2024-05-14 NOTE — Telephone Encounter (Signed)
 Called pt to advise him to bring in a semen sample for testing following his vasectomy, patient states that he has abstained from any sexual activity for religious purposes and he is not interested at this time in doing the semen analysis. Explained to pt test was needed to confirm success of the procedure and he still declined.

## 2024-08-02 ENCOUNTER — Ambulatory Visit (HOSPITAL_COMMUNITY)
Admission: EM | Admit: 2024-08-02 | Discharge: 2024-08-02 | Disposition: A | Discharge status: Home / Self Care | Attending: Nurse Practitioner | Admitting: Nurse Practitioner

## 2024-08-02 DIAGNOSIS — F411 Generalized anxiety disorder: Secondary | ICD-10-CM | POA: Insufficient documentation

## 2024-08-02 DIAGNOSIS — F410 Panic disorder [episodic paroxysmal anxiety] without agoraphobia: Secondary | ICD-10-CM | POA: Insufficient documentation

## 2024-08-02 DIAGNOSIS — F32A Depression, unspecified: Secondary | ICD-10-CM | POA: Insufficient documentation

## 2024-08-02 DIAGNOSIS — F419 Anxiety disorder, unspecified: Secondary | ICD-10-CM

## 2024-08-02 DIAGNOSIS — F1211 Cannabis abuse, in remission: Secondary | ICD-10-CM | POA: Insufficient documentation

## 2024-08-02 DIAGNOSIS — F422 Mixed obsessional thoughts and acts: Secondary | ICD-10-CM | POA: Insufficient documentation
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
## 2024-08-02 NOTE — ED Provider Notes (Incomplete)
 Behavioral Health Urgent Care Medical Screening Exam  Patient Name: Harold Anderson MRN: [id number] Date of Evaluation: 08/02/24 Chief Complaint:   I was hoping I'm gonna get some medications.  Diagnosis:  Final diagnoses:  GAD (generalized anxiety disorder)  Mixed obsessional thoughts and acts  Anxiety disorder with panic attacks  Anxiety and depression  Marijuana abuse in remission    History of Present illness: Harold Anderson is a 33 y.o. male. With no documented psychiatric history, who presented voluntarily as a walk in to [facility] with complaints of anxiety and panic attacks for the last four months. He is requesting benzodiazepines management.   Patient was seen face to face by this provider and chart reviewed.   Patient reports  I've been having severe panic attacks for four months, it's only getting worse and when it started, bad nightmares, sleep paralysis, a lot of seeing figures running around in the room, seeing number sequences and entities climbing on top of me, that stopped about a month ago.   Patient chronicles a life of continuous praying all day , every day, which is affecting him family and personal life ans suspects he has being bewitched by unknown persons.   Patient reports opening up to his wife and PCP, who suggested he gets help, which he did by calling the mobile crisis center, who advised he come to the [facility] for psychiatric evaluation.  Patient reports his PCP prescribed Seroquel and hydroxyzine which is not effective, and he would like to try some benzo's , which he took last year after his vasectomy and it really helped with his anxiety.   Patient reports applying other strategies such as going for long walks in the past, listening to music, watching comedy to manage his anxiety without success.   Patient decries his current lifestyle of prayer/religiosity, but denies being suicidal due to his religious belief as he didn't wanna end up in hell fire.    Patient reports intrusive thoughts which he describes as blasphemous, such as when he says Glory to God, he'll hear a thought saying F.. him, and when he says [employer], the voice will call Jesus a homosexual.   He denies illicit substance use, stating I last smoked weed six months ago, I used to smoke regularly.   He lives with his wife and 4 kids. Denies access to a gun. He denies history of suicide attempt or self harm. He denies history of inpatient psychiatric hospitalization.   He is not established with an outpatient psychiatrist of therapist. He endorses poor sleep and appetite.he reports ongoing use of Seroquel and Atarax which he recently started taking.   He is amenable to inpatient psychiatric hospitalization for stabilizationa nd treatment, but would like to go home and discuss with his wife. He is also requesting to have a personal room and access to Y[employer] t watch his relisgious programs if admitted. He is able to contracty fro sfatey.   Flowsheet Row ED from 08/02/2024 in [facility] ED from 11/30/2023 in [facility] Emergency Department at [facility] ED from 02/09/2021 in [facility] Emergency Department at [facility]  C-SSRS RISK CATEGORY No Risk No Risk No Risk    Psychiatric Specialty Exam  Presentation  General Appearance:Casual  Eye Contact:Good  Speech:Clear and Coherent  Speech Volume:Normal  Handedness:Right   Mood and Affect  Mood: Anxious  Affect: Congruent   Thought Process  Thought Processes: Coherent  Descriptions of Associations:Intact  Orientation:Full (Time, Place and  Person)  Thought Content:Obsessions; Rumination    Hallucinations:None  Ideas of Reference:None  Suicidal Thoughts:No  Homicidal Thoughts:No   Sensorium  Memory: Immediate Fair  Judgment: Fair  Insight: Poor   Executive Functions  Concentration: Good  Attention  Span: Good  Recall: Good  Fund of Knowledge: Good  Language: Good   Psychomotor Activity  Psychomotor Activity: Normal   Assets  Assets: Communication Skills; Desire for Improvement   Sleep  Sleep: Poor  Number of hours: No data recorded  Physical Exam: Physical Exam Constitutional:      Appearance: He is not toxic-appearing or diaphoretic.  HENT:     Nose: No congestion.  Cardiovascular:     Rate and Rhythm: Normal rate.  Pulmonary:     Effort: No respiratory distress.  Chest:     Chest wall: No tenderness.  Neurological:     Mental Status: He is alert and oriented to person, place, and time.  Psychiatric:        Mood and Affect: Mood is anxious and depressed.        Speech: Speech normal.        Behavior: Behavior is cooperative.        Thought Content: Thought content normal.    Review of Systems  Constitutional:  Negative for chills, diaphoresis and fever.  HENT:  Negative for congestion.   Eyes:  Negative for discharge.  Respiratory:  Negative for cough, shortness of breath and wheezing.   Cardiovascular:  Negative for chest pain and palpitations.  Gastrointestinal:  Negative for diarrhea, nausea and vomiting.  Neurological:  Negative for dizziness, seizures, loss of consciousness, weakness and headaches.  Psychiatric/Behavioral:  Positive for hallucinations. The patient is nervous/anxious.    Blood pressure 127/79, pulse 80, temperature 97.9 F (36.6 C), temperature source Oral, resp. rate 18, SpO2 100%. There is no height or weight on file to calculate BMI.  Musculoskeletal: Strength & Muscle Tone: within normal limits Gait & Station: normal Patient leans: N/A   [facility] MSE Discharge Disposition for Follow up and Recommendations: Based on my evaluation the patient does not appear to have an emergency medical condition and can be discharged with resources and follow up care in outpatient services for Medication Management and Individual  Therapy   Harold Hue LULLA [name], NP 08/02/2024, 11:58 PM

## 2024-08-02 NOTE — ED Provider Notes (Signed)
 Behavioral Health Urgent Care Medical Screening Exam  Patient Name: Harold Anderson MRN: [id number] Date of Evaluation: 08/02/24 Chief Complaint:   I was hoping I'm gonna get some medications.  Diagnosis:  Final diagnoses:  GAD (generalized anxiety disorder)  Mixed obsessional thoughts and acts  Anxiety disorder with panic attacks  Anxiety and depression  Marijuana abuse in remission    History of Present illness: Harold Anderson is a 33 y.o. male. With no documented psychiatric history, who presented voluntarily as a walk in to [facility] with complaints of anxiety and panic attacks for the last four months. He is requesting benzodiazepines management.   Patient was seen face to face by this provider and chart reviewed.   Patient reports  I've been having severe panic attacks for four months, it's only getting worse and when it started, bad nightmares, sleep paralysis, a lot of seeing figures running around in the room, seeing number sequences and entities climbing on top of me, that stopped about a month ago.   Patient chronicles a life of continuous praying all day , every day, which is affecting his family and personal life and suspects he has being bewitched by unknown persons.   Patient reports opening up to his wife and PCP, who suggested he gets help, which he did by calling the mobile crisis center, who came to his house and advised he come to the [facility] for psychiatric evaluation.  Patient reports his PCP prescribed Seroquel and hydroxyzine which is not effective, and he would like to be prescribed a benzo for home use, which he describes as effective for management of his anxiety last year after his vasectomy.  Patient reports applying other strategies such as going for long walks in the park, listening to music, and watching comedy to manage his anxiety without success.   Patient decries his current lifestyle of prayer/religiosity, but denies being suicidal due to his  religious belief as he didn't wanna end up in hell fire.   Patient reports intrusive thoughts which he describes as blasphemous, such as when he says Glory to God, he'll hear a thought saying F.. him, and when he says [employer], the voice will call Jesus a homosexual.   He denies illicit substance use, stating I last smoked weed six months ago, I used to smoke regularly.   He lives with his wife and 4 kids. Denies access to a gun. He denies history of suicide attempt or self harm. He denies history of inpatient psychiatric hospitalization.   He is not established with an outpatient psychiatrist of therapist. He endorses poor sleep and appetite. He reports ongoing use of Seroquel and Atarax which he recently started taking.   He is amenable to inpatient psychiatric hospitalization for stabilization and treatment, but would like to go home and discuss with his wife first and then return. He is able to contract for safety. He is also requesting to be prescribed a benzodiazepine, have a personal room and access to You Tube to watch his religious programs if admitted.  . Pt is also provided with resources for [facility] outpatient walk in clinic to establish care for new patient medication management and therapy.  He verbalize understanding and is in agreement.   On evaluation, patient is alert, oriented x 3, and cooperative. Speech is clear and coherent. Pt appears casually dressed. Eye contact is good.  Mood is anxious, affect is congruent with mood. Thought process is coherent and thought content is WDL. Pt denies SI/HI/AVH. There is no  objective indication that the patient is responding to internal stimuli. No delusions elicited during this assessment.      Flowsheet Row ED from 08/02/2024 in [facility] ED from 11/30/2023 in [facility] Emergency Department at [facility] ED from 02/09/2021 in [facility] Emergency Department at [facility]  C-SSRS  RISK CATEGORY No Risk No Risk No Risk    Psychiatric Specialty Exam  Presentation  General Appearance:Casual  Eye Contact:Good  Speech:Clear and Coherent  Speech Volume:Normal  Handedness:Right   Mood and Affect  Mood: Anxious  Affect: Congruent   Thought Process  Thought Processes: Coherent  Descriptions of Associations:Intact  Orientation:Full (Time, Place and Person)  Thought Content:Obsessions; Rumination    Hallucinations:None  Ideas of Reference:None  Suicidal Thoughts:No  Homicidal Thoughts:No   Sensorium  Memory: Immediate Fair  Judgment: Fair  Insight: Poor   Executive Functions  Concentration: Good  Attention Span: Good  Recall: Good  Fund of Knowledge: Good  Language: Good   Psychomotor Activity  Psychomotor Activity: Normal   Assets  Assets: Communication Skills; Desire for Improvement   Sleep  Sleep: Poor  Number of hours: No data recorded  Physical Exam: Physical Exam Constitutional:      Appearance: He is not toxic-appearing or diaphoretic.  HENT:     Nose: No congestion.  Cardiovascular:     Rate and Rhythm: Normal rate.  Pulmonary:     Effort: No respiratory distress.  Chest:     Chest wall: No tenderness.  Neurological:     Mental Status: He is alert and oriented to person, place, and time.  Psychiatric:        Mood and Affect: Mood is anxious and depressed.        Speech: Speech normal.        Behavior: Behavior is cooperative.        Thought Content: Thought content normal.    Review of Systems  Constitutional:  Negative for chills, diaphoresis and fever.  HENT:  Negative for congestion.   Eyes:  Negative for discharge.  Respiratory:  Negative for cough, shortness of breath and wheezing.   Cardiovascular:  Negative for chest pain and palpitations.  Gastrointestinal:  Negative for diarrhea, nausea and vomiting.  Neurological:  Negative for dizziness, seizures, loss of consciousness,  weakness and headaches.  Psychiatric/Behavioral:  Positive for hallucinations. The patient is nervous/anxious.    Blood pressure 127/79, pulse 80, temperature 97.9 F (36.6 C), temperature source Oral, resp. rate 18, SpO2 100%. There is no height or weight on file to calculate BMI.  Musculoskeletal: Strength & Muscle Tone: within normal limits Gait & Station: normal Patient leans: N/A   [facility] MSE Discharge Disposition for Follow up and Recommendations: Based on my evaluation the patient does not appear to have an emergency medical condition and can be discharged with resources and follow up care in outpatient services for Medication Management and Individual Therapy  Recommend for discharge and follow up with [facility] to establish care for new patient medication management and therapy.  Pt denies SI/HI/AVH.Patient does not meet IVC criteria at this time. There is no evidence of imminent risk of harm to self or others.   Discharge recommendations:  Patient is to take medications as prescribed. Please follow up with your primary care provider for all medical related needs.   Therapy: We recommend that patient participate in individual therapy to address mental health concerns.  Medications: The patient or guardian is to contact a  medical professional and/or outpatient provider to address any new side effects that develop. The patient or guardian should update outpatient providers of any new medications and/or medication changes.   Atypical antipsychotics: If you are prescribed an atypical antipsychotic, it is recommended that your height, weight, BMI, blood pressure, fasting lipid panel, and fasting blood sugar be monitored by your outpatient providers.  Safety:  The patient should abstain from use of illicit substances/drugs and abuse of any medications. If symptoms worsen or do not continue to improve or if the patient becomes actively suicidal or homicidal then it is recommended that  the patient return to the closest hospital emergency department, the [facility], or call 911 for further evaluation and treatment. National Suicide Prevention Lifeline 1-800-SUICIDE or (450) 726-2328.  About 988 988 offers 24/7 access to trained crisis counselors who can help people experiencing mental health-related distress. People can call or text 988 or chat 988lifeline.org for themselves or if they are worried about a loved one who may need crisis support.  Crisis Mobile: [facility]:                     405 811 4104 (for crisis response 24 hours a day) [facility] Hotline:                                            7340780648   Patient discharged home in stable condition.   Thurman LULLA [name], NP 08/02/2024, 11:58 PM

## 2024-08-02 NOTE — Discharge Instructions (Signed)

## 2024-08-13 ENCOUNTER — Ambulatory Visit (INDEPENDENT_AMBULATORY_CARE_PROVIDER_SITE_OTHER): Admitting: Physician Assistant

## 2024-08-13 ENCOUNTER — Encounter (HOSPITAL_COMMUNITY): Payer: Self-pay | Admitting: Physician Assistant

## 2024-08-13 VITALS — BP 131/83 | HR 61 | Ht 70.0 in | Wt 163.8 lb

## 2024-08-13 DIAGNOSIS — F331 Major depressive disorder, recurrent, moderate: Secondary | ICD-10-CM | POA: Insufficient documentation

## 2024-08-13 DIAGNOSIS — F410 Panic disorder [episodic paroxysmal anxiety] without agoraphobia: Secondary | ICD-10-CM | POA: Diagnosis not present

## 2024-08-13 DIAGNOSIS — F411 Generalized anxiety disorder: Secondary | ICD-10-CM

## 2024-08-13 MED ORDER — SERTRALINE HCL 100 MG PO TABS: 100.0000 mg | ORAL_TABLET | Freq: Every day | ORAL | 2 refills | Status: AC

## 2024-08-13 MED ORDER — MIRTAZAPINE 7.5 MG PO TABS: 7.5000 mg | ORAL_TABLET | Freq: Every day | ORAL | 2 refills | Status: AC

## 2024-08-13 MED ORDER — PROPRANOLOL HCL 20 MG PO TABS: 20.0000 mg | ORAL_TABLET | Freq: Three times a day (TID) | ORAL | 2 refills | Status: AC

## 2024-08-13 MED ORDER — CLONAZEPAM 0.5 MG PO TABS: 0.5000 mg | ORAL_TABLET | Freq: Every day | ORAL | 0 refills | Status: AC | PRN
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
## 2024-08-15 ENCOUNTER — Telehealth (HOSPITAL_COMMUNITY): Payer: Self-pay

## 2024-08-15 NOTE — Telephone Encounter (Signed)
 Patient called in left voicemail stating he has adverse symptoms to his medications. Patient states the propanolol 20 mg  gave him shortness of breath. Patient states that he has taken the mirtazapine  7.5 mg  for two days and woke up this morning with liver pain. Patient is requesting adjustments to his medication.

## 2024-08-30 ENCOUNTER — Emergency Department (HOSPITAL_COMMUNITY)
Admission: EM | Admit: 2024-08-30 | Discharge: 2024-08-31 | Disposition: A | Discharge status: Home / Self Care | Attending: Emergency Medicine | Admitting: Emergency Medicine

## 2024-08-30 ENCOUNTER — Emergency Department (HOSPITAL_COMMUNITY)

## 2024-08-30 ENCOUNTER — Other Ambulatory Visit: Payer: Self-pay

## 2024-08-30 DIAGNOSIS — R1084 Generalized abdominal pain: Secondary | ICD-10-CM | POA: Diagnosis not present

## 2024-08-30 DIAGNOSIS — K921 Melena: Secondary | ICD-10-CM | POA: Insufficient documentation

## 2024-08-30 LAB — I-STAT CHEM 8, ED
BUN: 13 mg/dL (ref 6–20)
Calcium, Ion: 1.16 mmol/L (ref 1.15–1.40)
Chloride: 102 mmol/L (ref 98–111)
Creatinine, Ser: 0.8 mg/dL (ref 0.61–1.24)
Glucose, Bld: 88 mg/dL (ref 70–99)
HCT: 46 % (ref 39.0–52.0)
Hemoglobin: 15.6 g/dL (ref 13.0–17.0)
Potassium: 4.2 mmol/L (ref 3.5–5.1)
Sodium: 140 mmol/L (ref 135–145)
TCO2: 28 mmol/L (ref 22–32)

## 2024-08-30 LAB — LIPASE, BLOOD: Lipase: 27 U/L (ref 11–51)

## 2024-08-30 LAB — URINALYSIS, ROUTINE W REFLEX MICROSCOPIC
Bacteria, UA: NONE SEEN
Bilirubin Urine: NEGATIVE
Glucose, UA: NEGATIVE mg/dL
Hgb urine dipstick: NEGATIVE
Ketones, ur: NEGATIVE mg/dL
Leukocytes,Ua: NEGATIVE
Nitrite: NEGATIVE
Protein, ur: NEGATIVE mg/dL
Specific Gravity, Urine: 1.015 (ref 1.005–1.030)
pH: 8 (ref 5.0–8.0)

## 2024-08-30 LAB — COMPREHENSIVE METABOLIC PANEL WITH GFR
ALT: 21 U/L (ref 0–44)
AST: 19 U/L (ref 15–41)
Albumin: 4.2 g/dL (ref 3.5–5.0)
Alkaline Phosphatase: 27 U/L — ABNORMAL LOW (ref 38–126)
Anion gap: 11 (ref 5–15)
BUN: 11 mg/dL (ref 6–20)
CO2: 26 mmol/L (ref 22–32)
Calcium: 9.2 mg/dL (ref 8.9–10.3)
Chloride: 102 mmol/L (ref 98–111)
Creatinine, Ser: 0.81 mg/dL (ref 0.61–1.24)
GFR, Estimated: >60 mL/min (ref >60)
Glucose, Bld: 91 mg/dL (ref 70–99)
Potassium: 4.2 mmol/L (ref 3.5–5.1)
Sodium: 139 mmol/L (ref 135–145)
Total Bilirubin: 0.9 mg/dL (ref 0.0–1.2)
Total Protein: 7 g/dL (ref 6.5–8.1)

## 2024-08-30 LAB — CBC WITH DIFFERENTIAL/PLATELET
Abs Immature Granulocytes: 0.02 K/uL (ref 0.00–0.07)
Basophils Absolute: 0.1 K/uL (ref 0.0–0.1)
Basophils Relative: 1 %
Eosinophils Absolute: 0.1 K/uL (ref 0.0–0.5)
Eosinophils Relative: 1 %
HCT: 45 % (ref 39.0–52.0)
Hemoglobin: 14.3 g/dL (ref 13.0–17.0)
Immature Granulocytes: 0 %
Lymphocytes Relative: 18 %
Lymphs Abs: 1.8 K/uL (ref 0.7–4.0)
MCH: 27 pg (ref 26.0–34.0)
MCHC: 31.8 g/dL (ref 30.0–36.0)
MCV: 85.1 fL (ref 80.0–100.0)
Monocytes Absolute: 0.7 K/uL (ref 0.1–1.0)
Monocytes Relative: 7 %
Neutro Abs: 7.2 K/uL (ref 1.7–7.7)
Neutrophils Relative %: 73 %
Platelets: 268 K/uL (ref 150–400)
RBC: 5.29 MIL/uL (ref 4.22–5.81)
RDW: 12.6 % (ref 11.5–15.5)
WBC: 9.8 K/uL (ref 4.0–10.5)
nRBC: 0 % (ref 0.0–0.2)

## 2024-08-30 MED ORDER — IOHEXOL 350 MG/ML SOLN
75.0000 mL | Freq: Once | INTRAVENOUS | Status: AC | PRN
  Administered 2024-08-30: 75 mL via INTRAVENOUS

## 2024-08-30 NOTE — ED Triage Notes (Signed)
 Pt here from home with abd  pain and some blood in his stool , pt thinks it may be from his Seroquel

## 2024-08-30 NOTE — ED Provider Triage Note (Signed)
 Emergency Medicine Provider Triage Evaluation Note  Harold Anderson , a 32 y.o. male  was evaluated in triage.  Pt complains of abdominal pain, rectal bleeding.  No lightheadedness.  He states he started Seroquel 2 weeks ago.  He states that it was helping until he noticed the bleeding.  He went to his psychiatrist and they are planning to switch the medicine.  Recently diagnosed with bipolar.  Denies SI.  Review of Systems  Positive: As above Negative: As above  Physical Exam  BP 134/88 (BP Location: Right Arm)   Pulse 70   Temp 98.2 F (36.8 C)   Resp 17   SpO2 100%  Gen:   Awake, no distress   Resp:  Normal effort  MSK:   Moves extremities without difficulty  Other:    Medical Decision Making  Medically screening exam initiated at 7:47 PM.  Appropriate orders placed.  Harold Anderson was informed that the remainder of the evaluation will be completed by another provider, this initial triage assessment does not replace that evaluation, and the importance of remaining in the ED until their evaluation is complete.    Harold Loge, PA-C 08/30/24 1949

## 2024-08-31 NOTE — ED Provider Notes (Signed)
 [place] [facility] AT [facility] Provider Note   CSN: [id number] Arrival date & time: 08/30/24  8090     Patient presents with: No chief complaint on file.   Harold Anderson is a 33 y.o. male.   33 year old male presents with concern for abdominal pain and bright red blood in his stools.  Patient reports history of bipolar disorder, recently started on Seroquel and Klonopin .  Patient first developed abdominal pain in the left upper quadrant, spread across his generalized abdominal area.  He thought this was related to his medication, discussed with his psychiatrist and he is scheduled to start a new medication tomorrow.  In the meantime, he had a bowel movement today with bright red blood in the stools as well as in the commode.  He denies weight loss, fevers.  States his mom does have ulcerative colitis.  He has never had a colonoscopy.  He is not on blood thinners.  No other complaints or concerns.       Prior to Admission medications   Medication Sig Start Date End Date Taking? Authorizing Provider  clonazePAM  (KLONOPIN ) 0.5 MG tablet Take 1 tablet (0.5 mg total) by mouth daily as needed for anxiety. 08/13/24   Nwoko, Uchenna E, PA  gabapentin (NEURONTIN) 300 MG capsule Take by mouth. 01/28/23   [provider]  HYDROcodone -acetaminophen  (NORCO/VICODIN) 5-325 MG tablet Take 1-2 tablets by mouth every 6 (six) hours as needed for severe pain (pain score 7-10). 11/30/23   Smoot, Lauraine LABOR, PA-C  mirtazapine  (REMERON ) 7.5 MG tablet Take 1 tablet (7.5 mg total) by mouth at bedtime. 08/13/24   Nwoko, Uchenna E, PA  propranolol  (INDERAL ) 20 MG tablet Take 1 tablet (20 mg total) by mouth 3 (three) times daily. 08/13/24   Nwoko, Uchenna E, PA  sertraline  (ZOLOFT ) 100 MG tablet Take 1 tablet (100 mg total) by mouth daily. 08/13/24   Nwoko, Uchenna E, PA  OMEPRAZOLE PO Take by mouth.  11/08/12  [provider]    Allergies: Azithromycin, Ibuprofen, Tramadol ,  and Sulfa antibiotics    Review of Systems Negative except as per HPI Updated Vital Signs BP 131/86 (BP Location: Left Arm)   Pulse 65   Temp 97.8 F (36.6 C) (Oral)   Resp 18   SpO2 100%   Physical Exam Vitals and nursing note reviewed.  Constitutional:      General: He is not in acute distress.    Appearance: He is well-developed. He is not diaphoretic.  HENT:     Head: Normocephalic and atraumatic.  Pulmonary:     Effort: Pulmonary effort is normal.  Abdominal:     General: There is no distension.     Palpations: Abdomen is soft.     Tenderness: There is no abdominal tenderness. There is no guarding or rebound.  Skin:    General: Skin is warm and dry.     Coloration: Skin is not pale.     Findings: No erythema or rash.  Neurological:     Mental Status: He is alert and oriented to person, place, and time.  Psychiatric:        Behavior: Behavior normal.     (all labs ordered are listed, but only abnormal results are displayed) Labs Reviewed  COMPREHENSIVE METABOLIC PANEL WITH GFR - Abnormal; Notable for the following components:      Result Value   Alkaline Phosphatase 27 (*)    All other components within normal limits  CBC WITH DIFFERENTIAL/PLATELET  URINALYSIS, ROUTINE W REFLEX MICROSCOPIC  LIPASE, BLOOD  I-STAT CHEM 8, ED    EKG: None  Radiology: CT ABDOMEN PELVIS W CONTRAST Result Date: 08/30/2024 CLINICAL DATA:  Abdominal pain, acute, nonlocalized. Rectal bleeding EXAM: CT ABDOMEN AND PELVIS WITH CONTRAST TECHNIQUE: Multidetector CT imaging of the abdomen and pelvis was performed using the standard protocol following bolus administration of intravenous contrast. RADIATION DOSE REDUCTION: This exam was performed according to the departmental dose-optimization program which includes automated exposure control, adjustment of the mA and/or kV according to patient size and/or use of iterative reconstruction technique. CONTRAST:  75mL OMNIPAQUE  IOHEXOL  350 MG/ML  SOLN COMPARISON:  03/09/2024 FINDINGS: Lower chest: No acute abnormality. Hepatobiliary: No focal liver abnormality is seen. No gallstones, gallbladder wall thickening, or biliary dilatation. Pancreas: Unremarkable Spleen: Unremarkable Adrenals/Urinary Tract: Adrenal glands are unremarkable. Kidneys are normal, without renal calculi, focal lesion, or hydronephrosis. Bladder is unremarkable. Stomach/Bowel: Stomach is within normal limits. Appendix appears normal. No evidence of bowel wall thickening, distention, or inflammatory changes. Vascular/Lymphatic: No significant vascular findings are present. No enlarged abdominal or pelvic lymph nodes. Reproductive: Prostate is unremarkable. Other: No abdominal wall hernia or abnormality. No abdominopelvic ascites. Musculoskeletal: No acute or significant osseous findings. IMPRESSION: 1. No acute intra-abdominal pathology identified. No definite radiographic explanation for the patient's reported symptoms. Electronically Signed   By: Dorethia Molt M.D.   On: 08/30/2024 23:57     Procedures   Medications Ordered in the ED  iohexol  (OMNIPAQUE ) 350 MG/ML injection 75 mL (75 mLs Intravenous Contrast Given 08/30/24 2337)                                    Medical Decision Making  This patient presents to the ED for concern of abdominal pain, this involves an extensive number of treatment options, and is a complaint that carries with it a high risk of complications and morbidity.  The differential diagnosis includes but not limited to constipation, colitis, diverticulitis    Co morbidities / Chronic conditions that complicate the patient evaluation  Bipolar disorder, anxiety   Additional history obtained:  Additional history obtained from EMR External records from outside source obtained and reviewed including prior labs on file   Lab Tests:  I Ordered, and personally interpreted labs.  The pertinent results include: CBC, CMP, urinalysis without  significant findings today.   Imaging Studies ordered:  I ordered imaging studies including CT abdomen pelvis I independently visualized and interpreted imaging which showed no acute process I agree with the radiologist interpretation    Problem List / ED Course / Critical interventions / Medication management  33 year old male with complaint of abdominal pain and now with bright red blood per rectum.  Labs are stable, CT without acute findings, vital stable.  No significant tenderness on exam today.  Patient does have family history of ulcerative colitis.  Patient to follow-up with GI.  Return to ER for worsening or concerning symptoms, in the meantime, recheck with PCP in 2 days. I have reviewed the patients home medicines and have made adjustments as needed    Social Determinants of Health:  Has PCP and specialty care team   Test / Admission - Considered:  Stable for discharge      Final diagnoses:  Generalized abdominal pain  Blood in stool    ED Discharge Orders     None  Beverley Leita LABOR, PA-C 08/31/24 ERLINDA Land, April, MD 08/31/24 [id number]

## 2024-08-31 NOTE — Discharge Instructions (Signed)
 Follow up with GI, please call to schedule an appointment. Recheck with your primary care provider in 2 days. Return to the ER for worsening or concerning symptoms.

## 2024-09-11 NOTE — Progress Notes (Deleted)
 BH MD/PA/NP OP Progress Note  09/11/2024 9:49 AM Harold Anderson  MRN:  [id number]  Visit Diagnosis: No diagnosis found.  Assessment:  Harold Anderson is a 33 year old male with a past psychiatric history significant for anxiety who presents to [facility] to establish psychiatric care and for medication management.   Patient presents to the encounter stating that he has been struggling with panic attacks for 4 to 5 months.  Patient reports that his panic attacks are preventing him from leaving the house as well as interacting with his family.  As a result of his panic attacks, patient reports that he also experiences worsening depression.  A PHQ-9 screen was performed with the patient scoring a 24.  A GAD-7 screen was also performed with the patient scoring a 21.   Patient reports that his panic attacks and anxiety were previously by his primary care provider and he was placed on sertraline  and hydroxyzine.  Patient reports that he later found out that he was allergic to hydroxyzine.  Patient reports that he went back to his primary care provider and he was prescribed buspirone and lorazepam.  He reports that buspirone worsened his anxiety while lorazepam help with managing both his panic attacks and anxiety.  Patient is currently taking the following psychiatric medications at this time:   Sertraline  50 mg daily Propranolol  10 mg 3 times daily   Patient is requesting to be placed back on lorazepam since he was only given a limited supply by his primary care provider.  Provider recommended patient increase his sertraline  dosage from 50 mg to 100 mg daily for the management of his depressive symptoms, anxiety, and panic attacks.  Provider also recommended patient increase his propranolol  dosage from 10 mg to 20 mg 3 times daily for the management of his anxiety.  Provider also recommended patient be placed on mirtazapine  7.5 mg at bedtime for the management  of his anxiety, depressive symptoms, and for sleep.  Patient was agreeable to recommendations.  Provider discussed side effects associated with patient's current medication regimen.   Instead of placing patient on lorazepam, provider recommended patient be placed on clonazepam  0.5 mg daily as needed for the management of his anxiety and panic attacks.  Patient was agreeable to recommendation.  Patient's medications to be e-prescribed to pharmacy of choice.  Risk Assessment: An assessment of suicide and violence risk factors was performed as part of this evaluation and is not significantly changed from the last visit. While future psychiatric events cannot be accurately predicted, the patient does not currently require acute inpatient psychiatric care and does not currently meet [place]  involuntary commitment criteria. Patient was given contact information for crisis resources, behavioral health clinic and was instructed to call 911 for emergencies.   Dorman Calderwood presents for follow-up evaluation. Today, 09/11/24, patient reports ***  1. Generalized anxiety disorder with panic attacks (Primary)   - sertraline  (ZOLOFT ) 100 MG tablet; Take 1 tablet (100 mg total) by mouth daily.  Dispense: 30 tablet; Refill: 2 - mirtazapine  (REMERON ) 7.5 MG tablet; Take 1 tablet (7.5 mg total) by mouth at bedtime.  Dispense: 30 tablet; Refill: 2 - propranolol  (INDERAL ) 20 MG tablet; Take 1 tablet (20 mg total) by mouth 3 (three) times daily.  Dispense: 90 tablet; Refill: 2 - clonazePAM  (KLONOPIN ) 0.5 MG tablet; Take 1 tablet (0.5 mg total) by mouth daily as needed for anxiety.  Dispense: 15 tablet; Refill: 0   2. 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  Dispense: 30 tablet; Refill: 2 - mirtazapine  (REMERON ) 7.5 MG tablet; Take 1 tablet (7.5 mg total) by mouth at bedtime.  Dispense: 30 tablet; Refill:  2  Chief Complaint: No chief complaint on file.  HPI:  Harold Anderson is a 33 year old male with a past psychiatric history significant for anxiety who presents to [facility] to establish psychiatric care and for medication management.   Patient presents to the encounter stating that he has been experiencing severe panic attacks.  Patient endorses worsening panic attacks that have been ongoing for 4 to 5 months.  He reports that it has been difficult for him to leave the house and he has not done so in the last 3 months.  He reports that he has remained in his room for months and has avoided interactions with his family.  He reports that he spends most of his days preoccupied with his notebook.   Patient reports that his panic attacks occur from the moment he wakes up from the moment he sleeps.  He also endorses difficulty with sleeping.  He reports that singing hymns and praying helps to alleviate his symptoms but the moment he breaks away to tend to his children, his panic attacks resurface.  Patient also reports that his blood pressure elevates when having panic attacks.   Patient reports that he went to his primary care provider for help with managing his panic attacks and was prescribed sertraline  and hydroxyzine.  While using hydroxyzine, patient reports that he found to have an allergic reaction to the medication.  Patient also reports that he presented to [facility] regarding concerns over his panic attacks.  After his assessment at [facility], patient was placed on sertraline  and hydroxyzine.  He reports that he went back to his primary care provider and was prescribed BuSpar and lorazepam.  He reports that buspirone made his anxiety worse but lorazepam was effective in managing his anxiety.  Patient reports that he has since ran out of his prescription of lorazepam.   Since struggling with his panic attacks, patient  reports that he has lost his appetite.  He reports that he has a history of going days without eating due to his anxiety.  Patient reports that his panic attacks are triggered by everything.  He reports that as soon as he leaves his notebook, his panic attacks occur.  When his panic attacks occur, patient reports that he feels like a deer caught in headlights.  He also reports being unable to think rationally during his panic attacks.  Patient's panic attacks are characterized by the following symptoms: elevated heart rate, heightened senses, difficulty breathing, hyperventilation, and crying spells.  He reports that his panic attacks are alleviated when crying or screaming out.   In addition to his anxiety and panic attacks, patient endorses depression and rates his depression a 10 out of 10.  Patient reports that his depression is attributed to his panic attacks.  He endorses depressive episodes every day.  Patient endorses the following depressive symptoms: feelings of sadness, lack of motivation, decreased concentration, decreased energy, irritability, feelings of guilt/worthlessness, and hopelessness.  Patient reports that his depression is alleviated by lorazepam.  He reports that when he was taking lorazepam, he was able to interact with his kids and he felt normal.   Patient denies a past history of hospitalization due  to mental health.  Patient further denies a past history of suicide attempts.  A PHQ-9 screen was performed with the patient scoring a 24.  A GAD-7 screen was also performed with the patient scoring a 21.   Patient is alert and oriented x 4, calm, cooperative, and fully engaged in conversation during the encounter.  Patient describes his mood as antsy.  Patient exhibits depressed and anxious mood. Patient denies suicidal or homicidal ideations.  He further denies auditory or visual hallucinations and does not appear to be responding to internal/external stimuli.  Patient endorses  paranoia but is unable to determine why he feels paranoid.  Patient denies delusional thoughts.  Patient endorses fair sleep and receives on average 3 to 5 hours of sleep per night.  Patient endorses good appetite and eats on average 2-3 meals per day.  Patient denies alcohol consumption, tobacco use, or illicit drug use.   Past Psychiatric History:  Patient reports that they were diagnosed with anxiety as a child.  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   Patient denies a past history of hospitalization due to mental health.   Patient denies a past history of suicide attempt.   Patient denies a past history of homicide attempt.  Past Medical History:  Past Medical History:  Diagnosis Date   Headache(784.0)    Reflux     Past Surgical History:  Procedure Laterality Date   TONSILLECTOMY     VASECTOMY      Family Psychiatric History:  Father - bipolar disorder Grandmother (paternal) - schizophrenia   Patient also reports that his aunt would never leave the home and was on medications.  Family History:  Family History  Problem Relation Age of Onset   Heart failure Other     Social History:  Social History   Socioeconomic History   Marital status: Married    Spouse name: Not on file   Number of children: Not on file   Years of education: Not on file   Highest education level: Not on file  Occupational History   Not on file  Tobacco Use   Smoking status: Former    Current packs/day: 0.00    Types: Cigarettes    Quit date: 08/08/2012    Years since quitting: 12.1   Smokeless tobacco: Not on file  Substance and Sexual Activity   Alcohol use: No   Drug use: No   Sexual activity: Not on file  Other Topics Concern   Not on file  Social History Narrative   Not on file   Social Drivers of Health   Financial Resource Strain: Not on file  Food Insecurity: Low Risk   (08/30/2023)   Received from [facility]   Hunger Vital Sign    Within the past 12 months, you worried that your food would run out before you got money to buy more: Never true    Within the past 12 months, the food you bought just didn't last and you didn't have money to get more. : Never true  Transportation Needs: Unmet Transportation Needs (08/30/2023)   Received from [facility]    In the past 12 months, has lack of reliable transportation kept you from medical appointments, meetings, work or from getting things needed for daily living? : Yes  Physical Activity: Not on file  Stress: Not on file  Social Connections: Unknown (10/26/2022)   Received from  [facility]   Social Network    Social Network: Not on file    Allergies:  Allergies  Allergen Reactions   Azithromycin Other (See Comments)    Acid reflux   Ibuprofen     Chest pain   Tramadol     Sulfa Antibiotics Rash    Current Medications: Current Outpatient Medications  Medication Sig Dispense Refill   clonazePAM  (KLONOPIN ) 0.5 MG tablet Take 1 tablet (0.5 mg total) by mouth daily as needed for anxiety. 15 tablet 0   gabapentin (NEURONTIN) 300 MG capsule Take by mouth.     HYDROcodone -acetaminophen  (NORCO/VICODIN) 5-325 MG tablet Take 1-2 tablets by mouth every 6 (six) hours as needed for severe pain (pain score 7-10). 8 tablet 0   mirtazapine  (REMERON ) 7.5 MG tablet Take 1 tablet (7.5 mg total) by mouth at bedtime. 30 tablet 2   propranolol  (INDERAL ) 20 MG tablet Take 1 tablet (20 mg total) by mouth 3 (three) times daily. 90 tablet 2   sertraline  (ZOLOFT ) 100 MG tablet Take 1 tablet (100 mg total) by mouth daily. 30 tablet 2   No current facility-administered medications for this visit.     Musculoskeletal: Strength & Muscle Tone: within normal limits Gait & Station: normal Patient leans: N/A  Psychiatric Specialty Exam: There were no vitals taken for this visit.There is no height or weight  on file to calculate BMI. Review of Systems  General Appearance: Casual and Fairly Groomed  Eye Contact:  Good  Speech:  Clear and Coherent  Volume:  Normal  Mood:  {BHH MOOD:22306}  Affect:  Congruent  Thought Content: Logical   Suicidal Thoughts:  No  Homicidal Thoughts:  No  Thought Process:  Coherent  Orientation:  Full (Time, Place, and Person)    Memory: Immediate;   Good Recent;   Good Remote;   Good  Judgment:  Fair  Insight:  Fair  Concentration:  Concentration: Good and Attention Span: Good  Recall:  not formally assessed   Fund of Knowledge: Fair  Language: Good  Psychomotor Activity:  Normal  Akathisia:  No  AIMS (if indicated): not done  Assets:  Architect Leisure Time Physical Health Resilience Social Support Transportation  ADL's:  Intact  Cognition: WNL  Sleep:  Good   Metabolic Disorder Labs: No results found for: HGBA1C, MPG No results found for: PROLACTIN No results found for: CHOL, TRIG, HDL, CHOLHDL, VLDL, LDLCALC No results found for: TSH  Therapeutic Level Labs: No results found for: LITHIUM No results found for: VALPROATE No results found for: CBMZ   Screenings: GAD-7    Flowsheet Row Office Visit from 08/13/2024 in [facility]  Total GAD-7 Score 21   PHQ2-9    Flowsheet Row Office Visit from 08/13/2024 in [facility] Office Visit from 03/10/2016 in Primary Care at [facility] Total Score 6 0  PHQ-9 Total Score 24 --   Flowsheet Row ED from 08/30/2024 in [facility] Emergency Department at [facility] Office Visit from 08/13/2024 in [facility] ED from 08/02/2024 in [facility]  C-SSRS RISK CATEGORY No Risk No Risk No Risk    Collaboration of Care: Collaboration of Care: Other ***  Patient/Guardian was advised Release of Information must be  obtained prior to any record release in order to collaborate their care with an outside provider. Patient/Guardian was advised if they have not already done so to contact the registration department  to sign all necessary forms in order for us  to release information regarding their care.   Consent: Patient/Guardian gives verbal consent for treatment and assignment of benefits for services provided during this visit. Patient/Guardian expressed understanding and agreed to proceed.    Jonetta Dagley, MD 09/11/2024, 9:49 AM

## 2024-09-25 ENCOUNTER — Encounter (HOSPITAL_COMMUNITY)

## 2024-09-28 ENCOUNTER — Other Ambulatory Visit

## 2024-10-01 ENCOUNTER — Other Ambulatory Visit

## 2024-10-04 ENCOUNTER — Encounter (HOSPITAL_COMMUNITY)

## 2024-10-04 ENCOUNTER — Other Ambulatory Visit: Payer: Self-pay

## 2024-10-04 ENCOUNTER — Other Ambulatory Visit

## 2024-10-04 DIAGNOSIS — Z302 Encounter for sterilization: Secondary | ICD-10-CM

## 2024-10-05 ENCOUNTER — Ambulatory Visit: Payer: Self-pay | Admitting: Urology

## 2024-10-05 LAB — POST-VAS SPERM EVALUATION,QUAL: Volume: 3 mL
